# Patient Record
Sex: Male | Born: 2000 | Hispanic: Yes | Marital: Single | Smoking: Never smoker
Health system: Southern US, Community
[De-identification: ages and names within clinical notes are randomized; demographics above are authoritative.]

## PROBLEM LIST (undated history)

## (undated) ENCOUNTER — Ambulatory Visit (HOSPITAL_COMMUNITY): Admission: EM | Payer: Medicaid Other | Source: Home / Self Care

## (undated) DIAGNOSIS — L0291 Cutaneous abscess, unspecified: Secondary | ICD-10-CM

## (undated) DIAGNOSIS — L732 Hidradenitis suppurativa: Secondary | ICD-10-CM

## (undated) HISTORY — PX: ACHILLES TENDON SURGERY: SHX542

---
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
## 2020-07-09 ENCOUNTER — Other Ambulatory Visit: Payer: Self-pay

## 2020-07-09 ENCOUNTER — Encounter (HOSPITAL_BASED_OUTPATIENT_CLINIC_OR_DEPARTMENT_OTHER): Payer: Self-pay

## 2020-07-09 DIAGNOSIS — D696 Thrombocytopenia, unspecified: Secondary | ICD-10-CM | POA: Diagnosis present

## 2020-07-09 DIAGNOSIS — E861 Hypovolemia: Secondary | ICD-10-CM | POA: Diagnosis present

## 2020-07-09 DIAGNOSIS — E860 Dehydration: Secondary | ICD-10-CM | POA: Diagnosis present

## 2020-07-09 DIAGNOSIS — A020 Salmonella enteritis: Principal | ICD-10-CM | POA: Diagnosis present

## 2020-07-09 DIAGNOSIS — Z79899 Other long term (current) drug therapy: Secondary | ICD-10-CM

## 2020-07-09 DIAGNOSIS — Z20822 Contact with and (suspected) exposure to covid-19: Secondary | ICD-10-CM | POA: Diagnosis present

## 2020-07-09 DIAGNOSIS — Z28310 Unvaccinated for covid-19: Secondary | ICD-10-CM

## 2020-07-09 DIAGNOSIS — E876 Hypokalemia: Secondary | ICD-10-CM | POA: Diagnosis present

## 2020-07-09 DIAGNOSIS — E871 Hypo-osmolality and hyponatremia: Secondary | ICD-10-CM | POA: Diagnosis present

## 2020-07-09 LAB — CBC WITH DIFFERENTIAL/PLATELET
Abs Immature Granulocytes: 0.04 10*3/uL (ref 0.00–0.07)
Basophils Absolute: 0 10*3/uL (ref 0.0–0.1)
Basophils Relative: 1 %
Eosinophils Absolute: 0 10*3/uL (ref 0.0–0.5)
Eosinophils Relative: 0 %
HCT: 43.1 % (ref 39.0–52.0)
Hemoglobin: 16.1 g/dL (ref 13.0–17.0)
Immature Granulocytes: 1 %
Lymphocytes Relative: 14 %
Lymphs Abs: 1 10*3/uL (ref 0.7–4.0)
MCH: 31.4 pg (ref 26.0–34.0)
MCHC: 37.4 g/dL — ABNORMAL HIGH (ref 30.0–36.0)
MCV: 84 fL (ref 80.0–100.0)
Monocytes Absolute: 0.7 10*3/uL (ref 0.1–1.0)
Monocytes Relative: 10 %
Neutro Abs: 4.9 10*3/uL (ref 1.7–7.7)
Neutrophils Relative %: 74 %
Platelets: 137 10*3/uL — ABNORMAL LOW (ref 150–400)
RBC: 5.13 MIL/uL (ref 4.22–5.81)
RDW: 10.7 % — ABNORMAL LOW (ref 11.5–15.5)
Smear Review: NORMAL
WBC: 7 10*3/uL (ref 4.0–10.5)
nRBC: 0 % (ref 0.0–0.2)

## 2020-07-09 LAB — COMPREHENSIVE METABOLIC PANEL
ALT: 13 U/L (ref 0–44)
AST: 18 U/L (ref 15–41)
Albumin: 4.2 g/dL (ref 3.5–5.0)
Alkaline Phosphatase: 46 U/L (ref 38–126)
Anion gap: 14 (ref 5–15)
BUN: 18 mg/dL (ref 6–20)
CO2: 21 mmol/L — ABNORMAL LOW (ref 22–32)
Calcium: 9.1 mg/dL (ref 8.9–10.3)
Chloride: 89 mmol/L — ABNORMAL LOW (ref 98–111)
Creatinine, Ser: 1.27 mg/dL — ABNORMAL HIGH (ref 0.61–1.24)
GFR, Estimated: >60 mL/min (ref >60)
Glucose, Bld: 105 mg/dL — ABNORMAL HIGH (ref 70–99)
Potassium: 3.1 mmol/L — ABNORMAL LOW (ref 3.5–5.1)
Sodium: 124 mmol/L — ABNORMAL LOW (ref 135–145)
Total Bilirubin: 1.8 mg/dL — ABNORMAL HIGH (ref 0.3–1.2)
Total Protein: 7.7 g/dL (ref 6.5–8.1)

## 2020-07-09 LAB — LIPASE, BLOOD: Lipase: 26 U/L (ref 11–51)

## 2020-07-09 MED ORDER — ACETAMINOPHEN 325 MG PO TABS
650.0000 mg | ORAL_TABLET | Freq: Once | ORAL | Status: AC | PRN
  Administered 2020-07-09: 650 mg via ORAL
  Filled 2020-07-09: qty 2

## 2020-07-09 NOTE — ED Triage Notes (Signed)
Pt presents with complaints of generalized abd pain, fever and diarrhea x 3 days.

## 2020-07-09 NOTE — ED Triage Notes (Signed)
Emergency Medicine Provider Triage Evaluation Note  Kenneth Harrell , a 20 y.o. male  was evaluated in triage.  Pt complains of generalized abdominal pain, fever, and diarrhea for 3 days.  T-max 103 F.  Patient admits to nausea, but denies vomiting.  No previous abdominal operations.  Denies sick contacts or known Covid exposures.  He is currently unvaccinated for COVID-19.  Patient tested negative for Covid on Saturday.   Review of Systems  Positive: Abdominal pain, diarrhea, fever Negative: vomiting  Physical Exam  BP 126/71 (BP Location: Left Arm)   Pulse (!) 116   Temp (!) 101.6 F (38.7 C) (Oral)   Resp 20   Ht 5\' 8"  (1.727 m)   Wt 71.2 kg   SpO2 95%   BMI 23.87 kg/m  Gen:   Awake, no distress   HEENT:  Atraumatic  Resp:  Normal effort  Cardiac:  Normal rate  Abd:   Nondistended, diffuse abdominal tenderness MSK:   Moves extremities without difficulty Neuro:  Speech clear   Medical Decision Making  Medically screening exam initiated at 10:30 PM.  Appropriate orders placed.  Kenneth Harrell was informed that the remainder of the evaluation will be completed by another provider, this initial triage assessment does not replace that evaluation, and the importance of remaining in the ED until their evaluation is complete.  Clinical Impression     Kenneth Harrell 07/09/20 2310

## 2020-07-10 ENCOUNTER — Inpatient Hospital Stay (HOSPITAL_BASED_OUTPATIENT_CLINIC_OR_DEPARTMENT_OTHER)
Admission: EM | Admit: 2020-07-10 | Discharge: 2020-07-12 | DRG: 372 | Disposition: A | Discharge status: Home / Self Care | Payer: Medicaid Other | Attending: Family Medicine | Admitting: Family Medicine

## 2020-07-10 ENCOUNTER — Encounter (HOSPITAL_COMMUNITY): Payer: Self-pay | Admitting: Internal Medicine

## 2020-07-10 ENCOUNTER — Emergency Department (HOSPITAL_BASED_OUTPATIENT_CLINIC_OR_DEPARTMENT_OTHER): Payer: Medicaid Other

## 2020-07-10 DIAGNOSIS — Z28310 Unvaccinated for covid-19: Secondary | ICD-10-CM | POA: Diagnosis not present

## 2020-07-10 DIAGNOSIS — D696 Thrombocytopenia, unspecified: Secondary | ICD-10-CM | POA: Diagnosis not present

## 2020-07-10 DIAGNOSIS — E876 Hypokalemia: Secondary | ICD-10-CM | POA: Diagnosis not present

## 2020-07-10 DIAGNOSIS — Z79899 Other long term (current) drug therapy: Secondary | ICD-10-CM | POA: Diagnosis not present

## 2020-07-10 DIAGNOSIS — K529 Noninfective gastroenteritis and colitis, unspecified: Secondary | ICD-10-CM

## 2020-07-10 DIAGNOSIS — E860 Dehydration: Secondary | ICD-10-CM | POA: Diagnosis not present

## 2020-07-10 DIAGNOSIS — R197 Diarrhea, unspecified: Secondary | ICD-10-CM | POA: Diagnosis present

## 2020-07-10 DIAGNOSIS — E871 Hypo-osmolality and hyponatremia: Secondary | ICD-10-CM

## 2020-07-10 DIAGNOSIS — Z20822 Contact with and (suspected) exposure to covid-19: Secondary | ICD-10-CM | POA: Diagnosis not present

## 2020-07-10 DIAGNOSIS — A020 Salmonella enteritis: Secondary | ICD-10-CM | POA: Diagnosis not present

## 2020-07-10 DIAGNOSIS — E861 Hypovolemia: Secondary | ICD-10-CM | POA: Diagnosis not present

## 2020-07-10 LAB — CBC
HCT: 41.6 % (ref 39.0–52.0)
Hemoglobin: 15 g/dL (ref 13.0–17.0)
MCH: 31.2 pg (ref 26.0–34.0)
MCHC: 36.1 g/dL — ABNORMAL HIGH (ref 30.0–36.0)
MCV: 86.5 fL (ref 80.0–100.0)
Platelets: 134 10*3/uL — ABNORMAL LOW (ref 150–400)
RBC: 4.81 MIL/uL (ref 4.22–5.81)
RDW: 10.8 % — ABNORMAL LOW (ref 11.5–15.5)
WBC: 5.5 10*3/uL (ref 4.0–10.5)
nRBC: 0 % (ref 0.0–0.2)

## 2020-07-10 LAB — URINALYSIS, ROUTINE W REFLEX MICROSCOPIC
Glucose, UA: NEGATIVE mg/dL
Ketones, ur: 40 mg/dL — AB
Leukocytes,Ua: NEGATIVE
Nitrite: NEGATIVE
Protein, ur: 30 mg/dL — AB
Specific Gravity, Urine: 1.01 (ref 1.005–1.030)
pH: 6.5 (ref 5.0–8.0)

## 2020-07-10 LAB — COMPREHENSIVE METABOLIC PANEL
ALT: 13 U/L (ref 0–44)
AST: 17 U/L (ref 15–41)
Albumin: 3.8 g/dL (ref 3.5–5.0)
Alkaline Phosphatase: 46 U/L (ref 38–126)
Anion gap: 11 (ref 5–15)
BUN: 12 mg/dL (ref 6–20)
CO2: 26 mmol/L (ref 22–32)
Calcium: 8.6 mg/dL — ABNORMAL LOW (ref 8.9–10.3)
Chloride: 97 mmol/L — ABNORMAL LOW (ref 98–111)
Creatinine, Ser: 1 mg/dL (ref 0.61–1.24)
GFR, Estimated: >60 mL/min (ref >60)
Glucose, Bld: 96 mg/dL (ref 70–99)
Potassium: 3.3 mmol/L — ABNORMAL LOW (ref 3.5–5.1)
Sodium: 134 mmol/L — ABNORMAL LOW (ref 135–145)
Total Bilirubin: 1.5 mg/dL — ABNORMAL HIGH (ref 0.3–1.2)
Total Protein: 7.1 g/dL (ref 6.5–8.1)

## 2020-07-10 LAB — RESP PANEL BY RT-PCR (FLU A&B, COVID) ARPGX2
Influenza A by PCR: NEGATIVE
Influenza B by PCR: NEGATIVE
SARS Coronavirus 2 by RT PCR: NEGATIVE

## 2020-07-10 LAB — URINALYSIS, MICROSCOPIC (REFLEX)

## 2020-07-10 LAB — C DIFFICILE QUICK SCREEN W PCR REFLEX
C Diff antigen: NEGATIVE
C Diff interpretation: NOT DETECTED
C Diff toxin: NEGATIVE

## 2020-07-10 LAB — PHOSPHORUS: Phosphorus: 2 mg/dL — ABNORMAL LOW (ref 2.5–4.6)

## 2020-07-10 LAB — MAGNESIUM: Magnesium: 1.9 mg/dL (ref 1.7–2.4)

## 2020-07-10 LAB — HIV ANTIBODY (ROUTINE TESTING W REFLEX): HIV Screen 4th Generation wRfx: NONREACTIVE

## 2020-07-10 MED ORDER — ONDANSETRON HCL 4 MG/2ML IJ SOLN
4.0000 mg | Freq: Once | INTRAMUSCULAR | Status: DC
  Filled 2020-07-10: qty 2

## 2020-07-10 MED ORDER — SODIUM CHLORIDE 0.9% FLUSH
3.0000 mL | Freq: Two times a day (BID) | INTRAVENOUS | Status: DC
  Administered 2020-07-10 – 2020-07-12 (×5): 3 mL via INTRAVENOUS

## 2020-07-10 MED ORDER — POTASSIUM CHLORIDE 10 MEQ/100ML IV SOLN
10.0000 meq | INTRAVENOUS | Status: AC
  Administered 2020-07-10 (×2): 10 meq via INTRAVENOUS
  Filled 2020-07-10 (×2): qty 100

## 2020-07-10 MED ORDER — ACETAMINOPHEN 325 MG PO TABS
650.0000 mg | ORAL_TABLET | Freq: Four times a day (QID) | ORAL | Status: DC | PRN
  Administered 2020-07-11 (×2): 650 mg via ORAL
  Filled 2020-07-10: qty 2

## 2020-07-10 MED ORDER — POTASSIUM CHLORIDE 10 MEQ/100ML IV SOLN
10.0000 meq | INTRAVENOUS | Status: AC
  Administered 2020-07-10 (×4): 10 meq via INTRAVENOUS
  Filled 2020-07-10 (×4): qty 100

## 2020-07-10 MED ORDER — ONDANSETRON HCL 4 MG PO TABS: 4.0000 mg | ORAL_TABLET | Freq: Four times a day (QID) | ORAL | Status: DC | PRN

## 2020-07-10 MED ORDER — SODIUM CHLORIDE 0.9 % IV SOLN: INTRAVENOUS | Status: DC

## 2020-07-10 MED ORDER — ONDANSETRON HCL 4 MG/2ML IJ SOLN
4.0000 mg | Freq: Four times a day (QID) | INTRAMUSCULAR | Status: DC | PRN
  Administered 2020-07-11: 4 mg via INTRAVENOUS
  Filled 2020-07-10: qty 2

## 2020-07-10 MED ORDER — POTASSIUM PHOSPHATES 15 MMOLE/5ML IV SOLN
20.0000 mmol | Freq: Once | INTRAVENOUS | Status: AC
  Administered 2020-07-10: 20 mmol via INTRAVENOUS
  Filled 2020-07-10: qty 6.67

## 2020-07-10 MED ORDER — ACETAMINOPHEN 650 MG RE SUPP: 650.0000 mg | Freq: Four times a day (QID) | RECTAL | Status: DC | PRN

## 2020-07-10 MED ORDER — SODIUM CHLORIDE 0.9 % IV BOLUS
500.0000 mL | Freq: Once | INTRAVENOUS | Status: AC
  Administered 2020-07-10: 500 mL via INTRAVENOUS

## 2020-07-10 MED ORDER — SODIUM CHLORIDE 0.9 % IV SOLN: INTRAVENOUS | Status: AC

## 2020-07-10 MED ORDER — IOHEXOL 300 MG/ML  SOLN
100.0000 mL | Freq: Once | INTRAMUSCULAR | Status: AC | PRN
  Administered 2020-07-10: 100 mL via INTRAVENOUS

## 2020-07-10 MED ORDER — DICLOFENAC SODIUM 1 % EX GEL
4.0000 g | Freq: Four times a day (QID) | CUTANEOUS | Status: DC | PRN
  Administered 2020-07-10: 4 g via TOPICAL
  Filled 2020-07-10: qty 100

## 2020-07-10 NOTE — ED Notes (Signed)
Report from Hang, RN  

## 2020-07-10 NOTE — ED Notes (Signed)
Pt leaves with [facility],  Report called.

## 2020-07-10 NOTE — H&P (Addendum)
History and Physical  Kenneth Harrell HMC:[id number] DOB: [birth year] DOA: 07/10/2020  PCP: Pcp, No   Chief Complaint: diarrhea  HPI:  20 year old male no significant PMH presented with several days of voluminous diarrhea.  Admitted for hyponatremia, voluminous diarrhea, presumed gastroenteritis.  Symptoms began suddenly 4/1 with fever and fatigue.  4/2 he developed voluminous diarrhea.  Nonbloody.  Mild abdominal cramping.  No nausea or vomiting.  Able to drink a lot of water.  No specific aggravating or alleviating factors.  Father also has similar symptoms.  No other friends or family or coworkers do.  Chart review: . No hospitalizations   ED Course: Treated with potassium, IV fluids, Tylenol  Review of Systems:  As above.  In addition negative for visual changes, sore throat, rash, new muscle aches, chest pain, shortness of breath, dysuria, bleeding  PMH . None  PSH . Achilles tendon surgery  Family history includes: . No particular medical problems that he is aware of  Social History . Non-smoker, nondrinker, no drugs  Allergies . None  Medications Current Outpatient Medications  Medication Instructions  . acetaminophen (TYLENOL) 650 mg, Oral, Every 6 hours PRN  . finasteride (PROPECIA) 1 mg, Oral, Daily    Physicial Exam   Vitals:  . 99.0, 18, 89, 124/75, 98% on room air  Constitutional:   . Appears calm and comfortable Eyes:  . pupils and irises appear normal . Normal lids  ENMT:  . grossly normal hearing  . Lips appear normal Neck:  . neck appears normal, no masses . no thyromegaly Respiratory:  . CTA bilaterally, no w/r/r.  . Respiratory effort normal.  Cardiovascular:  . RRR, no m/r/g . No LE extremity edema   Abdomen:  . Abdomen appears normal.  No masses.  Mild lower abdominal tenderness to palpation. . No hernias noted Musculoskeletal:  . Digits/nails BUE: no clubbing, cyanosis, petechiae, infection . RUE, LUE, RLE, LLE   o strength and  tone grossly normal, no atrophy, no abnormal movements o No tenderness, masses Skin:  . No rashes, lesions, ulcers . palpation of skin: no induration or nodules Psychiatric:  . Mental status o Mood, affect appropriate  I have personally reviewed following labs and imaging studies  Labs:  [name] Sodium 124, potassium 3.1, creatinine elevated 1.27, total bilirubin 1.8. [name] Platelets slightly low 137, remainder CBC unremarkable . Urinalysis equivocal . Gastrointestinal panel pending . Covid negative  Imaging studies:   CT abdomen pelvis widespread large and small bowel fluid-filled loops, suspected infectious or inflammatory acute enterocolitis.  No complicating features.    ASSESSMENT/PLAN  Acute enteric colitis with voluminous diarrhea, without nausea or vomiting and only very mild lower abdominal pain.  Father with similar condition. --Suspect viral gastroenteritis.  No recent antibiotics, no leukocytosis, signs or symptoms to suggest C. difficile.  Shared food with his father but with many other coworkers as well none of whom are sick, food poisoning seems less likely. --Supportive care IV fluids, diet as tolerated, GI pathogen panel  Hyponatremia, dehydration secondary to GI losses --Asymptomatic.  Normal saline.  Slow correction.  Modest thrombocytopenia probably secondary to acute infection --Follow CBC  Hypokalemia. --Replete  DVT prophylaxis: SCDs Code Status: Full Family Communication: none Consults called: none    Time spent: 50 minutes  Brendia Sacks, MD  [facility] Direct contact: see [email or web address]  7PM-7AM contact night coverage as below   1. Check the care team in [facility] and look for a) attending/consulting [facility] provider listed and b) the [facility] team listed  2. Log into [email or web address] and use [facility] universal password to access. If you do not have the password, please contact the hospital operator. 3. Locate the [facility] provider you are looking for under  [facility] and page to a number that you can be directly reached. 4. If you still have difficulty reaching the provider, please page the [facility] (Director on Call) for the Hospitalists listed on amion for assistance.  Severity of Illness: The appropriate patient status for this patient is OBSERVATION. Observation status is judged to be reasonable and necessary in order to provide the required intensity of service to ensure the patient's safety. The patient's presenting symptoms, physical exam findings, and initial radiographic and laboratory data in the context of their medical condition is felt to place them at decreased risk for further clinical deterioration. Furthermore, it is anticipated that the patient will be medically stable for discharge from the hospital within 2 midnights of admission. The following factors support the patient status of observation.   " The patient's presenting symptoms include diarrhea. " The physical exam findings include mild lower abd pain. " The initial radiographic and laboratory data are hyponatremia, enterocoltis.    Status is: Observation  The patient remains OBS appropriate and will d/c before 2 midnights.  Dispo: The patient is from: Home              Anticipated d/c is to: Home              Patient currently is not medically stable to d/c.   Difficult to place patient No   07/10/2020, 11:49 AM   Principal Problem:   Enterocolitis Active Problems:   Hyponatremia   Dehydration   Thrombocytopenia (HCC)

## 2020-07-10 NOTE — ED Notes (Signed)
Report to Irving Burton, Education officer, environmental

## 2020-07-10 NOTE — ED Notes (Signed)
Report to [employer], [facility]

## 2020-07-10 NOTE — ED Provider Notes (Signed)
[facility] EMERGENCY DEPARTMENT Provider Note   CSN: [id number] Arrival date & time: 07/09/20  2158     History Chief Complaint  Patient presents with  . Abdominal Pain    Kenneth Harrell is a 20 y.o. male.  Patient reports lower abdominal pain, fever and diarrhea for the past 4 days.  Symptoms started as crampy lower abdominal pain with fever up to 102 and 103.  Father with similar symptoms.  No recent travel.  No antibiotic use.  Having profuse watery diarrhea up to 10 or 20 times daily.  Is nonbloody.  Reports drinking "a lot of water" to try to make up for diarrhea but no vomiting.  No pain with urination or blood in the urine.  No previous abdominal surgeries.  Most of his pain is crampy around his umbilicus.  He is unable to comment times of diarrhea has had for day but is nonbloody.  He denies any chest pain or shortness of breath.  No cough.  No runny nose or sore throat.  He is not COVID vaccinated. Reports his father has similar symptoms but did not travel anywhere.  No antibiotic use  The history is provided by the patient.  Abdominal Pain Associated symptoms: diarrhea, fatigue, fever and nausea   Associated symptoms: no chest pain, no cough, no dysuria, no hematuria, no shortness of breath and no vomiting        Past Medical History:  Diagnosis Date  . Abscess     There are no problems to display for this patient.   Past Surgical History:  Procedure Laterality Date  . ACHILLES TENDON SURGERY         No family history on file.  Social History   Tobacco Use  . Smoking status: Never Smoker  . Smokeless tobacco: Never Used  Vaping Use  . Vaping Use: Never used  Substance Use Topics  . Alcohol use: No  . Drug use: No    Home Medications Prior to Admission medications   Medication Sig Start Date End Date Taking? Authorizing Provider  amoxicillin (AMOXIL) 400 MG/5ML suspension Take 6.3 mLs (500 mg total) by mouth 3 (three) times daily. Patient  not taking: Reported on 10/14/2017 01/30/14   Palumbo, April, MD  amoxicillin (AMOXIL) 875 MG tablet Take 875 mg by mouth 2 (two) times daily.    [provider]  cephALEXin (KEFLEX) 500 MG capsule Take 1 capsule (500 mg total) by mouth 4 (four) times daily. Patient not taking: Reported on 10/14/2017 12/21/15   Felicie MornSmith, David, NP  finasteride (PROPECIA) 1 MG tablet Take 1 mg by mouth daily.    [provider]  guaiFENesin (MUCINEX) 600 MG 12 hr tablet Take 1,200 mg by mouth once.    [provider]  ibuprofen (ADVIL,MOTRIN) 200 MG tablet Take 200 mg by mouth every 6 (six) hours as needed.    [provider]  naproxen (NAPROSYN) 500 MG tablet Take 1 tablet (500 mg total) by mouth 2 (two) times daily. Patient not taking: Reported on 10/14/2017 12/21/15   Felicie MornSmith, David, NP  oseltamivir (TAMIFLU) 75 MG capsule Take 1 capsule (75 mg total) by mouth every 12 (twelve) hours. Patient not taking: Reported on 10/14/2017 05/27/15   Geoffery Lyonselo, Douglas, MD  traMADol (ULTRAM) 50 MG tablet Take 1 tablet (50 mg total) by mouth every 6 (six) hours as needed. Patient not taking: Reported on 10/14/2017 01/22/17   Jacinto HalimMaczis, Michael M, PA-C    Allergies    Patient has  no known allergies.  Review of Systems   Review of Systems  Constitutional: Positive for activity change, appetite change, fatigue and fever.  Respiratory: Negative for cough and shortness of breath.   Cardiovascular: Negative for chest pain.  Gastrointestinal: Positive for abdominal pain, diarrhea and nausea. Negative for vomiting.  Genitourinary: Negative for dysuria and hematuria.  Musculoskeletal: Positive for arthralgias and myalgias.  Skin: Negative for wound.  Neurological: Positive for weakness and headaches.   all other systems are negative except as noted in the HPI and PMH.    Physical Exam Updated Vital Signs BP (!) 143/91 (BP Location: Left Arm)   Pulse 72   Temp 98.9 F (37.2 C)   Resp 14   Ht 5\' 8"   (1.727 m)   Wt 71.2 kg   SpO2 100%   BMI 23.87 kg/m   Physical Exam Vitals and nursing note reviewed.  Constitutional:      General: He is not in acute distress.    Appearance: He is well-developed. He is ill-appearing.  HENT:     Head: Normocephalic and atraumatic.     Mouth/Throat:     Mouth: Mucous membranes are dry.     Pharynx: No oropharyngeal exudate.  Eyes:     Conjunctiva/sclera: Conjunctivae normal.     Pupils: Pupils are equal, round, and reactive to light.  Neck:     Comments: No meningismus. Cardiovascular:     Rate and Rhythm: Normal rate and regular rhythm.     Heart sounds: Normal heart sounds. No murmur heard.   Pulmonary:     Effort: Pulmonary effort is normal. No respiratory distress.     Breath sounds: Normal breath sounds.  Abdominal:     Palpations: Abdomen is soft.     Tenderness: There is abdominal tenderness. There is no guarding or rebound.     Comments: Periumbilical tenderness with voluntary guarding  Musculoskeletal:        General: No tenderness. Normal range of motion.     Cervical back: Normal range of motion and neck supple.  Skin:    General: Skin is warm.  Neurological:     Mental Status: He is alert and oriented to person, place, and time.     Cranial Nerves: No cranial nerve deficit.     Motor: No abnormal muscle tone.     Coordination: Coordination normal.     Comments: No ataxia on finger to nose bilaterally. No pronator drift. 5/5 strength throughout. CN 2-12 intact.Equal grip strength. Sensation intact.   Psychiatric:        Behavior: Behavior normal.     ED Results / Procedures / Treatments   Labs (all labs ordered are listed, but only abnormal results are displayed) Labs Reviewed  CBC WITH DIFFERENTIAL/PLATELET - Abnormal; Notable for the following components:      Result Value   MCHC 37.4 (*)    RDW 10.7 (*)    Platelets 137 (*)    All other components within normal limits  COMPREHENSIVE METABOLIC PANEL - Abnormal;  Notable for the following components:   Sodium 124 (*)    Potassium 3.1 (*)    Chloride 89 (*)    CO2 21 (*)    Glucose, Bld 105 (*)    Creatinine, Ser 1.27 (*)    Total Bilirubin 1.8 (*)    All other components within normal limits  URINALYSIS, ROUTINE W REFLEX MICROSCOPIC - Abnormal; Notable for the following components:   Hgb urine dipstick TRACE (*)  Bilirubin Urine SMALL (*)    Ketones, ur 40 (*)    Protein, ur 30 (*)    All other components within normal limits  URINALYSIS, MICROSCOPIC (REFLEX) - Abnormal; Notable for the following components:   Bacteria, UA RARE (*)    All other components within normal limits  RESP PANEL BY RT-PCR (FLU A&B, COVID) ARPGX2  C DIFFICILE QUICK SCREEN W PCR REFLEX  GASTROINTESTINAL PANEL BY PCR, STOOL (REPLACES STOOL CULTURE)  LIPASE, BLOOD    EKG None  Radiology CT ABDOMEN PELVIS W CONTRAST  Result Date: 07/10/2020 CLINICAL DATA:  20 year old male with abdominal pain, fever for 3 days, diarrhea. EXAM: CT ABDOMEN AND PELVIS WITH CONTRAST TECHNIQUE: Multidetector CT imaging of the abdomen and pelvis was performed using the standard protocol following bolus administration of intravenous contrast. CONTRAST:  OMNIPAQUE IOHEXOL 300 MG/ML  SOLN COMPARISON:  Chest radiographs 07/27/2017. FINDINGS: Lower chest: Negative. Hepatobiliary: Negative liver and gallbladder. Pancreas: Negative. Spleen: Negative.  Incidental splenule (normal variant). Adrenals/Urinary Tract: Negative. Adrenals, kidneys and ureters appear symmetric and normal. Unremarkable bladder. Stomach/Bowel: Fluid and mucosal hyperenhancement throughout the rectosigmoid colon (series 2, image 68). Mucosal hyperenhancement at the anal verge also. Borderline to mild large bowel wall thickening diffusely. With mild mucosal hyperenhancement also throughout the ascending and descending colon. Appendix also appears to be similarly fluid-filled and with mucosal hyperenhancement although  nondilated (series 2, image 61 and coronal image 45). There is no Peri appendiceal inflammatory stranding. And there is little to no background large bowel mesenteric inflammation. Similar wall thickening and mucosal hyperenhancement throughout the terminal ileum and multiple distal small bowel loops which are nondilated. Fluid-filled nondilated small bowel elsewhere. The stomach and duodenum have a more normal appearance. No free air. No free fluid. Vascular/Lymphatic: Major vascular structures in the abdomen and pelvis appear to be patent and normal. No lymphadenopathy. No lymphadenopathy. Reproductive: Negative. Other: No pelvic free fluid. Musculoskeletal: Negative. IMPRESSION: Widespread small and large bowel fluid-filled loops with mucosal hyperenhancement and mild bowel wall thickening. Suspect an infectious or inflammatory Acute Enterocolitis. Fluid in the large bowel to the rectum is compatible with diarrhea. No associated free fluid, obstruction, or other complicating features. Electronically Signed   By: Odessa Fleming M.D.   On: 07/10/2020 05:21    Procedures .Critical Care Performed by: Glynn Octave, MD Authorized by: Glynn Octave, MD   Critical care provider statement:    Critical care time (minutes):  35   Critical care was necessary to treat or prevent imminent or life-threatening deterioration of the following conditions:  Dehydration and metabolic crisis   Critical care was time spent personally by me on the following activities:  Discussions with consultants, evaluation of patient's response to treatment, examination of patient, ordering and performing treatments and interventions, ordering and review of laboratory studies, ordering and review of radiographic studies, pulse oximetry, re-evaluation of patient's condition, obtaining history from patient or surrogate and review of old charts     Medications Ordered in ED Medications  sodium chloride 0.9 % bolus 500 mL (has no  administration in time range)  0.9 %  sodium chloride infusion (has no administration in time range)  ondansetron (ZOFRAN) injection 4 mg (has no administration in time range)  potassium chloride 10 mEq in 100 mL IVPB (has no administration in time range)  acetaminophen (TYLENOL) tablet 650 mg (650 mg Oral Given 07/09/20 2218)    ED Course  I have reviewed the triage vital signs and the nursing notes.  Pertinent labs &  imaging results that were available during my care of the patient were reviewed by me and considered in my medical decision making (see chart for details).    MDM Rules/Calculators/A&P                         4 days of fever, diarrhea, abdominal pain.  His father with similar symptoms.  Vitals stable.  Periumbilical tenderness.  Labs in triage show sodium of 124.  No comparison. Patient does not drink alcohol. States he has been drinking water at home to try to make up for his diarrhea.  CT with enterocolitis. And diarrheal state. Stool studies ordered.  With hyponatremia, will require observation admission for gentle correction and supportive care.  D/w Dr. Toniann Fail. Final Clinical Impression(s) / ED Diagnoses Final diagnoses:  Enterocolitis  Hyponatremia    Rx / DC Orders ED Discharge Orders    None       Kelley Knoth, Jeannett Senior, MD 07/10/20 [phone]

## 2020-07-11 DIAGNOSIS — K529 Noninfective gastroenteritis and colitis, unspecified: Secondary | ICD-10-CM | POA: Diagnosis not present

## 2020-07-11 DIAGNOSIS — E871 Hypo-osmolality and hyponatremia: Secondary | ICD-10-CM | POA: Diagnosis present

## 2020-07-11 DIAGNOSIS — E876 Hypokalemia: Secondary | ICD-10-CM | POA: Diagnosis present

## 2020-07-11 DIAGNOSIS — A020 Salmonella enteritis: Secondary | ICD-10-CM | POA: Diagnosis present

## 2020-07-11 DIAGNOSIS — R197 Diarrhea, unspecified: Secondary | ICD-10-CM | POA: Diagnosis not present

## 2020-07-11 DIAGNOSIS — E860 Dehydration: Secondary | ICD-10-CM | POA: Diagnosis present

## 2020-07-11 DIAGNOSIS — Z28310 Unvaccinated for covid-19: Secondary | ICD-10-CM | POA: Diagnosis not present

## 2020-07-11 DIAGNOSIS — E861 Hypovolemia: Secondary | ICD-10-CM | POA: Diagnosis present

## 2020-07-11 DIAGNOSIS — D696 Thrombocytopenia, unspecified: Secondary | ICD-10-CM | POA: Diagnosis present

## 2020-07-11 DIAGNOSIS — Z20822 Contact with and (suspected) exposure to covid-19: Secondary | ICD-10-CM | POA: Diagnosis present

## 2020-07-11 DIAGNOSIS — Z79899 Other long term (current) drug therapy: Secondary | ICD-10-CM | POA: Diagnosis not present

## 2020-07-11 LAB — COMPREHENSIVE METABOLIC PANEL
ALT: 11 U/L (ref 0–44)
AST: 15 U/L (ref 15–41)
Albumin: 3.6 g/dL (ref 3.5–5.0)
Alkaline Phosphatase: 45 U/L (ref 38–126)
Anion gap: 9 (ref 5–15)
BUN: 8 mg/dL (ref 6–20)
CO2: 23 mmol/L (ref 22–32)
Calcium: 8.2 mg/dL — ABNORMAL LOW (ref 8.9–10.3)
Chloride: 101 mmol/L (ref 98–111)
Creatinine, Ser: 0.85 mg/dL (ref 0.61–1.24)
GFR, Estimated: >60 mL/min (ref >60)
Glucose, Bld: 107 mg/dL — ABNORMAL HIGH (ref 70–99)
Potassium: 3 mmol/L — ABNORMAL LOW (ref 3.5–5.1)
Sodium: 133 mmol/L — ABNORMAL LOW (ref 135–145)
Total Bilirubin: 1.4 mg/dL — ABNORMAL HIGH (ref 0.3–1.2)
Total Protein: 6.1 g/dL — ABNORMAL LOW (ref 6.5–8.1)

## 2020-07-11 LAB — GASTROINTESTINAL PANEL BY PCR, STOOL (REPLACES STOOL CULTURE)

## 2020-07-11 LAB — CBC
HCT: 37.8 % — ABNORMAL LOW (ref 39.0–52.0)
Hemoglobin: 13.3 g/dL (ref 13.0–17.0)
MCH: 30.9 pg (ref 26.0–34.0)
MCHC: 35.2 g/dL (ref 30.0–36.0)
MCV: 87.7 fL (ref 80.0–100.0)
Platelets: 159 10*3/uL (ref 150–400)
RBC: 4.31 MIL/uL (ref 4.22–5.81)
RDW: 10.9 % — ABNORMAL LOW (ref 11.5–15.5)
WBC: 6.3 10*3/uL (ref 4.0–10.5)
nRBC: 0 % (ref 0.0–0.2)

## 2020-07-11 LAB — MAGNESIUM: Magnesium: 1.7 mg/dL (ref 1.7–2.4)

## 2020-07-11 LAB — PHOSPHORUS: Phosphorus: 2.1 mg/dL — ABNORMAL LOW (ref 2.5–4.6)

## 2020-07-11 MED ORDER — SODIUM CHLORIDE 0.9 % IV SOLN
500.0000 mg | Freq: Once | INTRAVENOUS | Status: DC
  Filled 2020-07-11: qty 500

## 2020-07-11 MED ORDER — AZITHROMYCIN 250 MG PO TABS
500.0000 mg | ORAL_TABLET | Freq: Every day | ORAL | Status: DC
  Administered 2020-07-12: 500 mg via ORAL
  Filled 2020-07-11: qty 2

## 2020-07-11 MED ORDER — DEXTROSE 5 % IV SOLN
1000.0000 mg | Freq: Once | INTRAVENOUS | Status: AC
  Administered 2020-07-11: 1000 mg via INTRAVENOUS
  Filled 2020-07-11: qty 1000

## 2020-07-11 MED ORDER — POTASSIUM CHLORIDE CRYS ER 20 MEQ PO TBCR
40.0000 meq | EXTENDED_RELEASE_TABLET | ORAL | Status: AC
  Administered 2020-07-11 (×2): 40 meq via ORAL
  Filled 2020-07-11 (×2): qty 2

## 2020-07-11 NOTE — Progress Notes (Signed)
PROGRESS NOTE    Kenneth Harrell  CZY:[id number] DOB: 2000/08/28 DOA: 07/10/2020 PCP: Pcp, No   Chief Complaint  Patient presents with  . Abdominal Pain   Brief Narrative:  20 year old male no significant PMH presented with several days of voluminous diarrhea.  Admitted for hyponatremia, voluminous diarrhea, now found to have salmonella enterocolitis. He notes eating sunny side up eggs prior to his symptoms, dad ate similar food and was also ill.   Assessment & Plan:   Principal Problem:   Enterocolitis Active Problems:   Hyponatremia   Dehydration   Thrombocytopenia (HCC)  Salmonella Enterocolitis CT 4/5 with small and large bowel fluid filled loops with mucosal hyperenhancement and mild bowel wall thickening concerning for infectious vs inflammatory acute enterocolitis.  GI path panel notably positive for salmonella species Follow blood cx Abx discussed with pharm, will treat with 7 days of azithromycin  Continued significant diarrhea today, will continue inpatient treatment until this improves  Hyponatremia  Hypokalemia Likely hypovolemic hyponatremia in setting of diarrhea - follow with IVF Replace K and follow  Thrombocytopenia resolved  DVT prophylaxis: SCD Code Status: full  Family Communication: none at bedside Disposition:   Status is: Observation  The patient will require care spanning > 2 midnights and should be moved to inpatient because: Inpatient level of care appropriate due to severity of illness  Dispo: The patient is from: Home              Anticipated d/c is to: Home              Patient currently is not medically stable to d/c.   Difficult to place patient No       Consultants:   none  Procedures:   none  Antimicrobials:  Anti-infectives (From admission, onward)   Start     Dose/Rate Route Frequency Ordered Stop   07/12/20 1000  azithromycin (ZITHROMAX) tablet 500 mg        500 mg Oral Daily 07/11/20 0956 07/18/20 0959   07/11/20  1315  azithromycin (ZITHROMAX) 1,000 mg in dextrose 5 % 250 mL IVPB        1,000 mg 250 mL/hr over 60 Minutes Intravenous  Once 07/11/20 1218     07/11/20 1200  azithromycin (ZITHROMAX) 500 mg in sodium chloride 0.9 % 250 mL IVPB  Status:  Discontinued        500 mg 250 mL/hr over 60 Minutes Intravenous  Once 07/11/20 0956 07/11/20 1218     Subjective: No new complaints Diarrhea not much better  Objective: Vitals:   07/10/20 1002 07/10/20 1423 07/10/20 2209 07/11/20 0433  BP: 124/75 111/60 (!) 147/85 (!) 120/56  Pulse: 89 92 79 81  Resp: 18 18 18 18   Temp: 99 F (37.2 C) 99.3 F (37.4 C) 97.6 F (36.4 C) 98.5 F (36.9 C)  TempSrc: Oral Oral Oral Oral  SpO2: 98% 98% 100% 98%  Weight:      Height:        Intake/Output Summary (Last 24 hours) at 07/11/2020 1209 Last data filed at 07/11/2020 0500 Gross per 24 hour  Intake 1801.48 ml  Output --  Net 1801.48 ml   Filed Weights   07/09/20 2212  Weight: 71.2 kg    Examination:  General exam: Appears calm and comfortable  Respiratory system: Clear to auscultation. Respiratory effort normal. Cardiovascular system: S1 & S2 heard, RRR. Gastrointestinal system: Abdomen is nondistended, soft and mildly tender Central nervous system: Alert and oriented. No focal neurological deficits.  Extremities: no LEE Skin: No rashes, lesions or ulcers Psychiatry: Judgement and insight appear normal. Mood & affect appropriate.     Data Reviewed: I have personally reviewed following labs and imaging studies  CBC: Recent Labs  Lab 07/09/20 2301 07/10/20 1200 07/11/20 0338  WBC 7.0 5.5 6.3  NEUTROABS 4.9  --   --   HGB 16.1 15.0 13.3  HCT 43.1 41.6 37.8*  MCV 84.0 86.5 87.7  PLT 137* 134* 159    Basic Metabolic Panel: Recent Labs  Lab 07/09/20 2301 07/10/20 1200 07/11/20 0338  NA 124* 134* 133*  K 3.1* 3.3* 3.0*  CL 89* 97* 101  CO2 21* 26 23  GLUCOSE 105* 96 107*  BUN 18 12 8   CREATININE 1.27* 1.00 0.85  CALCIUM 9.1  8.6* 8.2*  MG  --  1.9 1.7  PHOS  --  2.0* 2.1*    GFR: Estimated Creatinine Clearance: 134.1 mL/min (by C-G formula based on SCr of 0.85 mg/dL).  Liver Function Tests: Recent Labs  Lab 07/09/20 2301 07/10/20 1200 07/11/20 0338  AST 18 17 15   ALT 13 13 11   ALKPHOS 46 46 45  BILITOT 1.8* 1.5* 1.4*  PROT 7.7 7.1 6.1*  ALBUMIN 4.2 3.8 3.6    CBG: No results for input(s): GLUCAP in the last 168 hours.   Recent Results (from the past 240 hour(s))  Resp Panel by RT-PCR (Flu Keshawna Dix&B, Covid) Nasopharyngeal Swab     Status: None   Collection Time: 07/09/20 12:42 AM   Specimen: Nasopharyngeal Swab; Nasopharyngeal(NP) swabs in vial transport medium  Result Value Ref Range Status   SARS Coronavirus 2 by RT PCR NEGATIVE NEGATIVE Final    Comment: (NOTE) SARS-CoV-2 target nucleic acids are NOT DETECTED.  The SARS-CoV-2 RNA is generally detectable in upper respiratory specimens during the acute phase of infection. The lowest concentration of SARS-CoV-2 viral copies this assay can detect is 138 copies/mL. Morghan Kester negative result does not preclude SARS-Cov-2 infection and should not be used as the sole basis for treatment or other patient management decisions. Kauan Kloosterman negative result may occur with  improper specimen collection/handling, submission of specimen other than nasopharyngeal swab, presence of viral mutation(s) within the areas targeted by this assay, and inadequate number of viral copies(<138 copies/mL). Analiese Krupka negative result must be combined with clinical observations, patient history, and epidemiological information. The expected result is Negative.  Fact Sheet for Patients:  BloggerCourse.comhttps://www.fda.gov/media/152166/download  Fact Sheet for Healthcare Providers:  SeriousBroker.ithttps://www.fda.gov/media/152162/download  This test is no t yet approved or cleared by the [place] FDA and  has been authorized for detection and/or diagnosis of SARS-CoV-2 by FDA under an Emergency Use Authorization (EUA).  This EUA will remain  in effect (meaning this test can be used) for the duration of the COVID-19 declaration under Section 564(b)(1) of the Act, 21 U.S.C.section 360bbb-3(b)(1), unless the authorization is terminated  or revoked sooner.       Influenza Helmi Hechavarria by PCR NEGATIVE NEGATIVE Final   Influenza B by PCR NEGATIVE NEGATIVE Final    Comment: (NOTE) The Xpert Xpress SARS-CoV-2/FLU/RSV plus assay is intended as an aid in the diagnosis of influenza from Nasopharyngeal swab specimens and should not be used as Zavior Thomason sole basis for treatment. Nasal washings and aspirates are unacceptable for Xpert Xpress SARS-CoV-2/FLU/RSV testing.  Fact Sheet for Patients: BloggerCourse.comhttps://www.fda.gov/media/152166/download  Fact Sheet for Healthcare Providers: SeriousBroker.ithttps://www.fda.gov/media/152162/download  This test is not yet approved or cleared by the [place] FDA and has been authorized for detection and/or diagnosis  of SARS-CoV-2 by FDA under an Emergency Use Authorization (EUA). This EUA will remain in effect (meaning this test can be used) for the duration of the COVID-19 declaration under Section 564(b)(1) of the Act, 21 U.S.C. section 360bbb-3(b)(1), unless the authorization is terminated or revoked.  Performed at [facility], [address] [place], [place] [address]   C Difficile Quick Screen w PCR reflex     Status: None   Collection Time: 07/10/20  7:25 AM   Specimen: STOOL  Result Value Ref Range Status   C Diff antigen NEGATIVE NEGATIVE Final   C Diff toxin NEGATIVE NEGATIVE Final   C Diff interpretation No C. difficile detected.  Final    Comment: Performed at [facility], [address] [place], [place] [address]  Gastrointestinal Panel by PCR , Stool     Status: Abnormal   Collection Time: 07/10/20  7:25 AM   Specimen: STOOL  Result Value Ref Range Status   Campylobacter species NOT DETECTED NOT DETECTED Final   Plesimonas shigelloides NOT DETECTED NOT DETECTED Final    Salmonella species DETECTED (Lisle Skillman) NOT DETECTED Final    Comment: RESULT CALLED TO, READ BACK BY AND VERIFIED WITH: Aldona Bar RN [phone] 07/11/20 HNM    Yersinia enterocolitica NOT DETECTED NOT DETECTED Final   Vibrio species NOT DETECTED NOT DETECTED Final   Vibrio cholerae NOT DETECTED NOT DETECTED Final   Enteroaggregative E coli (EAEC) NOT DETECTED NOT DETECTED Final   Enteropathogenic E coli (EPEC) NOT DETECTED NOT DETECTED Final   Enterotoxigenic E coli (ETEC) NOT DETECTED NOT DETECTED Final   Shiga like toxin producing E coli (STEC) NOT DETECTED NOT DETECTED Final   Shigella/Enteroinvasive E coli (EIEC) NOT DETECTED NOT DETECTED Final   Cryptosporidium NOT DETECTED NOT DETECTED Final   Cyclospora cayetanensis NOT DETECTED NOT DETECTED Final   Entamoeba histolytica NOT DETECTED NOT DETECTED Final   Giardia lamblia NOT DETECTED NOT DETECTED Final   Adenovirus F40/41 NOT DETECTED NOT DETECTED Final   Astrovirus NOT DETECTED NOT DETECTED Final   Norovirus GI/GII NOT DETECTED NOT DETECTED Final   Rotavirus Taran Hable NOT DETECTED NOT DETECTED Final   Sapovirus (I, II, IV, and V) NOT DETECTED NOT DETECTED Final    Comment: Performed at [facility], [address], [place], [place] [address]         Radiology Studies: CT ABDOMEN PELVIS W CONTRAST  Result Date: 07/10/2020 CLINICAL DATA:  20 year old male with abdominal pain, fever for 3 days, diarrhea. EXAM: CT ABDOMEN AND PELVIS WITH CONTRAST TECHNIQUE: Multidetector CT imaging of the abdomen and pelvis was performed using the standard protocol following bolus administration of intravenous contrast. CONTRAST:  OMNIPAQUE IOHEXOL 300 MG/ML  SOLN COMPARISON:  Chest radiographs 07/27/2017. FINDINGS: Lower chest: Negative. Hepatobiliary: Negative liver and gallbladder. Pancreas: Negative. Spleen: Negative.  Incidental splenule (normal variant). Adrenals/Urinary Tract: Negative. Adrenals, kidneys and ureters appear symmetric and  normal. Unremarkable bladder. Stomach/Bowel: Fluid and mucosal hyperenhancement throughout the rectosigmoid colon (series 2, image 68). Mucosal hyperenhancement at the anal verge also. Borderline to mild large bowel wall thickening diffusely. With mild mucosal hyperenhancement also throughout the ascending and descending colon. Appendix also appears to be similarly fluid-filled and with mucosal hyperenhancement although nondilated (series 2, image 61 and coronal image 45). There is no Peri appendiceal inflammatory stranding. And there is little to no background large bowel mesenteric inflammation. Similar wall thickening and mucosal hyperenhancement throughout the terminal ileum and multiple distal small bowel  loops which are nondilated. Fluid-filled nondilated small bowel elsewhere. The stomach and duodenum have Willard Madrigal more normal appearance. No free air. No free fluid. Vascular/Lymphatic: Major vascular structures in the abdomen and pelvis appear to be patent and normal. No lymphadenopathy. No lymphadenopathy. Reproductive: Negative. Other: No pelvic free fluid. Musculoskeletal: Negative. IMPRESSION: Widespread small and large bowel fluid-filled loops with mucosal hyperenhancement and mild bowel wall thickening. Suspect an infectious or inflammatory Acute Enterocolitis. Fluid in the large bowel to the rectum is compatible with diarrhea. No associated free fluid, obstruction, or other complicating features. Electronically Signed   By: Odessa Fleming M.D.   On: 07/10/2020 05:21        Scheduled Meds: . [START ON 07/12/2020] azithromycin  500 mg Oral Daily  . ondansetron  4 mg Intravenous Once  . potassium chloride  40 mEq Oral Q4H  . sodium chloride flush  3 mL Intravenous Q12H   Continuous Infusions: . sodium chloride 125 mL/hr at 07/10/20 0415  . sodium chloride 125 mL/hr at 07/11/20 0605  . azithromycin       LOS: 0 days    Time spent: over 30 min    Lacretia Nicks, MD [facility]   To  contact the attending provider between 7A-7P or the covering provider during after hours 7P-7A, please log into the web site [email or web address] and access using universal [place] password for that web site. If you do not have the password, please call the hospital operator.  07/11/2020, 12:09 PM

## 2020-07-12 LAB — CBC WITH DIFFERENTIAL/PLATELET
Abs Immature Granulocytes: 0.12 10*3/uL — ABNORMAL HIGH (ref 0.00–0.07)
Basophils Absolute: 0.1 10*3/uL (ref 0.0–0.1)
Basophils Relative: 1 %
Eosinophils Absolute: 0.1 10*3/uL (ref 0.0–0.5)
Eosinophils Relative: 1 %
HCT: 37.6 % — ABNORMAL LOW (ref 39.0–52.0)
Hemoglobin: 13.1 g/dL (ref 13.0–17.0)
Immature Granulocytes: 2 %
Lymphocytes Relative: 20 %
Lymphs Abs: 1.5 10*3/uL (ref 0.7–4.0)
MCH: 30.8 pg (ref 26.0–34.0)
MCHC: 34.8 g/dL (ref 30.0–36.0)
MCV: 88.5 fL (ref 80.0–100.0)
Monocytes Absolute: 1.3 10*3/uL — ABNORMAL HIGH (ref 0.1–1.0)
Monocytes Relative: 19 %
Neutro Abs: 4.1 10*3/uL (ref 1.7–7.7)
Neutrophils Relative %: 57 %
Platelets: 175 10*3/uL (ref 150–400)
RBC: 4.25 MIL/uL (ref 4.22–5.81)
RDW: 11.1 % — ABNORMAL LOW (ref 11.5–15.5)
WBC: 7.1 10*3/uL (ref 4.0–10.5)
nRBC: 0 % (ref 0.0–0.2)

## 2020-07-12 LAB — COMPREHENSIVE METABOLIC PANEL
ALT: 13 U/L (ref 0–44)
AST: 16 U/L (ref 15–41)
Albumin: 3.3 g/dL — ABNORMAL LOW (ref 3.5–5.0)
Alkaline Phosphatase: 43 U/L (ref 38–126)
Anion gap: 9 (ref 5–15)
BUN: 10 mg/dL (ref 6–20)
CO2: 26 mmol/L (ref 22–32)
Calcium: 8.5 mg/dL — ABNORMAL LOW (ref 8.9–10.3)
Chloride: 103 mmol/L (ref 98–111)
Creatinine, Ser: 0.92 mg/dL (ref 0.61–1.24)
GFR, Estimated: >60 mL/min (ref >60)
Glucose, Bld: 100 mg/dL — ABNORMAL HIGH (ref 70–99)
Potassium: 3.1 mmol/L — ABNORMAL LOW (ref 3.5–5.1)
Sodium: 138 mmol/L (ref 135–145)
Total Bilirubin: 1.1 mg/dL (ref 0.3–1.2)
Total Protein: 6 g/dL — ABNORMAL LOW (ref 6.5–8.1)

## 2020-07-12 LAB — MAGNESIUM: Magnesium: 1.7 mg/dL (ref 1.7–2.4)

## 2020-07-12 LAB — PHOSPHORUS: Phosphorus: 2.4 mg/dL — ABNORMAL LOW (ref 2.5–4.6)

## 2020-07-12 MED ORDER — WITCH HAZEL-GLYCERIN EX PADS
MEDICATED_PAD | CUTANEOUS | Status: DC | PRN
  Filled 2020-07-12: qty 100

## 2020-07-12 MED ORDER — POTASSIUM CHLORIDE ER 10 MEQ PO TBCR: 20.0000 meq | EXTENDED_RELEASE_TABLET | Freq: Every day | ORAL | 0 refills | Status: AC

## 2020-07-12 MED ORDER — AZITHROMYCIN 250 MG PO TABS: 500.0000 mg | ORAL_TABLET | Freq: Every day | ORAL | 0 refills | Status: AC

## 2020-07-12 MED ORDER — HYDROCORTISONE (PERIANAL) 2.5 % EX CREA: 1.0000 "application " | TOPICAL_CREAM | Freq: Two times a day (BID) | CUTANEOUS | 0 refills | Status: AC

## 2020-07-12 MED ORDER — POTASSIUM CHLORIDE CRYS ER 20 MEQ PO TBCR
40.0000 meq | EXTENDED_RELEASE_TABLET | ORAL | Status: AC
  Administered 2020-07-12 (×2): 40 meq via ORAL
  Filled 2020-07-12 (×2): qty 2

## 2020-07-12 NOTE — Discharge Instructions (Signed)
Diarrhea, Adult Diarrhea is when you pass loose and watery poop (stool) often. Diarrhea can make you feel weak and cause you to lose water in your body (get dehydrated). Losing water in your body can cause you to:  Feel tired and thirsty.  Have Ilisha Blust dry mouth.  Go pee (urinate) less often. Diarrhea often lasts 2-3 days. However, it can last longer if it is Yuriel Lopezmartinez sign of something more serious. It is important to treat your diarrhea as told by your doctor. Follow these instructions at home: Eating and drinking Follow these instructions as told by your doctor:  Take an ORS (oral rehydration solution). This is Kesia Dalto drink that helps you replace fluids and minerals your body lost. It is sold at pharmacies and stores.  Drink plenty of fluids, such as: ? Water. ? Ice chips. ? Diluted fruit juice. ? Low-calorie sports drinks. ? Milk, if you want.  Avoid drinking fluids that have Deneshia Zucker lot of sugar or caffeine in them.  Eat bland, easy-to-digest foods in small amounts as you are able. These foods include: ? Bananas. ? Applesauce. ? Rice. ? Low-fat (lean) meats. ? Toast. ? Crackers.  Avoid alcohol.  Avoid spicy or fatty foods.      Medicines  Take over-the-counter and prescription medicines only as told by your doctor.  If you were prescribed an antibiotic medicine, take it as told by your doctor. Do not stop using the antibiotic even if you start to feel better. General instructions  Wash your hands often using soap and water. If soap and water are not available, use Andalyn Heckstall hand sanitizer. Others in your home should wash their hands as well. Hands should be washed: ? After using the toilet or changing Laylee Schooley diaper. ? Before preparing, cooking, or serving food. ? While caring for Brodi Kari sick person. ? While visiting someone in [facility].  Drink enough fluid to keep your pee (urine) pale yellow.  Rest at home while you get better.  Watch your condition for any changes.  Take Marilyne Haseley warm bath to help  with any burning or pain from having diarrhea.  Keep all follow-up visits as told by your doctor. This is important.   Contact Theseus Birnie doctor if:  You have Conita Amenta fever.  Your diarrhea gets worse.  You have new symptoms.  You cannot keep fluids down.  You feel light-headed or dizzy.  You have Brinnley Lacap headache.  You have muscle cramps. Get help right away if:  You have chest pain.  You feel very weak or you pass out (faint).  You have bloody or black poop or poop that looks like tar.  You have very bad pain, cramping, or bloating in your belly (abdomen).  You have trouble breathing or you are breathing very quickly.  Your heart is beating very quickly.  Your skin feels cold and clammy.  You feel confused.  You have signs of losing too much water in your body, such as: ? Dark pee, very little pee, or no pee. ? Cracked lips. ? Dry mouth. ? Sunken eyes. ? Sleepiness. ? Weakness. Summary  Diarrhea is when you pass loose and watery poop (stool) often.  Diarrhea can make you feel weak and cause you to lose water in your body (get dehydrated).  Take an ORS (oral rehydration solution). This is Carmencita Cusic drink that is sold at pharmacies and stores.  Eat bland, easy-to-digest foods in small amounts as you are able.  Contact Shatha Hooser doctor if your condition gets worse. Get help   right away if you have signs that you have lost too much water in your body. This information is not intended to replace advice given to you by your health care provider. Make sure you discuss any questions you have with your health care provider. Document Revised: 08/28/2017 Document Reviewed: 08/28/2017 Elsevier Patient Education  2021 Elsevier Inc.  

## 2020-07-12 NOTE — Discharge Summary (Signed)
Physician Discharge Summary  Kenneth Harrell NWG:[id number] DOB: 08-Nov-2000 DOA: 07/10/2020  Kenneth Harrell: Kenneth Harrell, No  Admit date: 07/10/2020 Discharge date: 07/12/2020  Time spent: 40 minutes  Recommendations for Outpatient Follow-up:  1. Follow outpatient CBC/CMP 2. Follow pending blood cultures  3. Follow diarrhea outpatient   Discharge Diagnoses:  Principal Problem:   Enterocolitis Active Problems:   Hyponatremia   Dehydration   Thrombocytopenia (HCC)   Salmonella enteritis   Discharge Condition: stable  Diet recommendation: regular  Filed Weights   07/09/20 2212  Weight: 71.2 kg    History of present illness:  20 year old male no significant PMH presented with several days of voluminous diarrhea. Admitted for hyponatremia, voluminous diarrhea, now found to have salmonella enterocolitis. He notes eating sunny side up eggs prior to his symptoms, dad ate similar food and was also ill.   He's gradually improved with antibiotics, he has continued diarrhea today, but feels well enough to discharge and is able to maintain his oral intake at this time.  Plan for discharge with oral antibiotics and return precautions.    Hospital Course:  Salmonella Enterocolitis CT 4/5 with small and large bowel fluid filled loops with mucosal hyperenhancement and mild bowel wall thickening concerning for infectious vs inflammatory acute enterocolitis.  GI path panel notably positive for salmonella species Follow blood cx - NGx1 day Abx discussed with pharm, will treat with 7 days of azithromycin  Continued diarrhea today, but suspect he'll be able to keep up with needs given improvement in symptoms - discussed return precautions   Hyponatremia  Hypokalemia Hyponatremia improved Will d/c home with supplemental potassium  Thrombocytopenia resolved  Procedures:  none  Consultations:  none  Discharge Exam: Vitals:   07/12/20 0507 07/12/20 1423  BP: 127/71 129/81  Pulse: 86 76  Resp: 18 18   Temp: 98.7 F (37.1 C) 98.9 F (37.2 C)  SpO2: 99% 100%   Continued diarrhea, but feeling better, stronger Able to keep food and fluids down He and mother comfortable discharging - discussed return precautions  General: No acute distress. Cardiovascular: Heart sounds show Kenneth Harrell regular rate, and rhythm Lungs: Clear to auscultation bilaterally  Abdomen: Soft, nontender, nondistended Neurological: Alert and oriented 3. Moves all extremities 4. Cranial nerves II through XII grossly intact. Skin: Warm and dry. No rashes or lesions. Extremities: No clubbing or cyanosis. No edema.   Discharge Instructions   Discharge Instructions    Call MD for:  difficulty breathing, headache or visual disturbances   Complete by: As directed    Call MD for:  extreme fatigue   Complete by: As directed    Call MD for:  hives   Complete by: As directed    Call MD for:  persistant dizziness or light-headedness   Complete by: As directed    Call MD for:  persistant nausea and vomiting   Complete by: As directed    Call MD for:  redness, tenderness, or signs of infection (pain, swelling, redness, odor or green/yellow discharge around incision site)   Complete by: As directed    Call MD for:  severe uncontrolled pain   Complete by: As directed    Call MD for:  temperature >100.4   Complete by: As directed    Diet - low sodium heart healthy   Complete by: As directed    Discharge instructions   Complete by: As directed    You were seen for salmonella enterocolitis.    You've improved, but your diarrhea has continued.  It's  important that you stay hydrated with gatorade or pedialyte to help replace the fluids and electrolytes you'll lose through the diarrhea.  We'll send you home with Kenneth Harrell course of azithromycin for 5 days.   You have [name] pending blood culture that's so far no growth.  Please follow the final results with your Kenneth Harrell.    Return for new, recurrent, or worsening symptoms.   Please ask your  Kenneth Harrell to request records from this hospitalization so they know what was done and what the next steps will be.   Increase activity slowly   Complete by: As directed      Allergies as of 07/12/2020   No Known Allergies     Medication List    TAKE these medications   acetaminophen 325 MG tablet Commonly known as: TYLENOL Take 650 mg by mouth every 6 (six) hours as needed for mild pain, fever or headache.   azithromycin 250 MG tablet Commonly known as: ZITHROMAX Take 2 tablets (500 mg total) by mouth daily for 5 days.   Kenneth Harrell 1 MG tablet Commonly known as: PROPECIA Take 1 mg by mouth daily.   hydrocortisone 2.5 % rectal cream Commonly known as: Anusol-HC Place 1 application rectally 2 (two) times daily for 5 days.   potassium chloride 10 MEQ tablet Commonly known as: KLOR-CON Take 2 tablets (20 mEq total) by mouth daily for 3 days.      No Known Allergies    The results of significant diagnostics from this hospitalization (including imaging, microbiology, ancillary and laboratory) are listed below for reference.    Significant Diagnostic Studies: CT ABDOMEN PELVIS W CONTRAST  Result Date: 07/10/2020 CLINICAL DATA:  20 year old male with abdominal pain, fever for 3 days, diarrhea. EXAM: CT ABDOMEN AND PELVIS WITH CONTRAST TECHNIQUE: Multidetector CT imaging of the abdomen and pelvis was performed using the standard protocol following bolus administration of intravenous contrast. CONTRAST:  100mL OMNIPAQUE IOHEXOL 300 MG/ML  SOLN COMPARISON:  Chest radiographs 07/27/2017. FINDINGS: Lower chest: Negative. Hepatobiliary: Negative liver and gallbladder. Pancreas: Negative. Spleen: Negative.  Incidental splenule (normal variant). Adrenals/Urinary Tract: Negative. Adrenals, kidneys and ureters appear symmetric and normal. Unremarkable bladder. Stomach/Bowel: Fluid and mucosal hyperenhancement throughout the rectosigmoid colon (series 2, image 68). Mucosal hyperenhancement at the  anal verge also. Borderline to mild large bowel wall thickening diffusely. With mild mucosal hyperenhancement also throughout the ascending and descending colon. Appendix also appears to be similarly fluid-filled and with mucosal hyperenhancement although nondilated (series 2, image 61 and coronal image 45). There is no Peri appendiceal inflammatory stranding. And there is little to no background large bowel mesenteric inflammation. Similar wall thickening and mucosal hyperenhancement throughout the terminal ileum and multiple distal small bowel loops which are nondilated. Fluid-filled nondilated small bowel elsewhere. The stomach and duodenum have Kenneth Harrell more normal appearance. No free air. No free fluid. Vascular/Lymphatic: Major vascular structures in the abdomen and pelvis appear to be patent and normal. No lymphadenopathy. No lymphadenopathy. Reproductive: Negative. Other: No pelvic free fluid. Musculoskeletal: Negative. IMPRESSION: Widespread small and large bowel fluid-filled loops with mucosal hyperenhancement and mild bowel wall thickening. Suspect an infectious or inflammatory Acute Enterocolitis. Fluid in the large bowel to the rectum is compatible with diarrhea. No associated free fluid, obstruction, or other complicating features. Electronically Signed   By: Odessa FlemingH  Hall M.D.   On: 07/10/2020 05:21    Microbiology: Recent Results (from the past 240 hour(s))  Resp Panel by RT-PCR (Flu Kenneth Harrell&B, Covid) Nasopharyngeal Swab     Status:  None   Collection Time: 07/09/20 12:42 AM   Specimen: Nasopharyngeal Swab; Nasopharyngeal(NP) swabs in vial transport medium  Result Value Ref Range Status   SARS Coronavirus 2 by RT PCR NEGATIVE NEGATIVE Final    Comment: (NOTE) SARS-CoV-2 target nucleic acids are NOT DETECTED.  The SARS-CoV-2 RNA is generally detectable in upper respiratory specimens during the acute phase of infection. The lowest concentration of SARS-CoV-2 viral copies this assay can detect is 138  copies/mL. Kenneth Harrell negative result does not preclude SARS-Cov-2 infection and should not be used as the sole basis for treatment or other patient management decisions. Kenneth Harrell negative result may occur with  improper specimen collection/handling, submission of specimen other than nasopharyngeal swab, presence of viral mutation(s) within the areas targeted by this assay, and inadequate number of viral copies(<138 copies/mL). Kenneth Harrell negative result must be combined with clinical observations, patient history, and epidemiological information. The expected result is Negative.  Fact Sheet for Patients:  BloggerCourse.com  Fact Sheet for Healthcare Providers:  SeriousBroker.it  This test is no t yet approved or cleared by the [place] FDA and  has been authorized for detection and/or diagnosis of SARS-CoV-2 by FDA under an Emergency Use Authorization (EUA). This EUA will remain  in effect (meaning this test can be used) for the duration of the COVID-19 declaration under Section 564(b)(1) of the Act, 21 U.S.C.section 360bbb-3(b)(1), unless the authorization is terminated  or revoked sooner.       Influenza Nanako Stopher by PCR NEGATIVE NEGATIVE Final   Influenza B by PCR NEGATIVE NEGATIVE Final    Comment: (NOTE) The Xpert Xpress SARS-CoV-2/FLU/RSV plus assay is intended as an aid in the diagnosis of influenza from Nasopharyngeal swab specimens and should not be used as Kenneth Harrell sole basis for treatment. Nasal washings and aspirates are unacceptable for Xpert Xpress SARS-CoV-2/FLU/RSV testing.  Fact Sheet for Patients: BloggerCourse.com  Fact Sheet for Healthcare Providers: SeriousBroker.it  This test is not yet approved or cleared by the [place] FDA and has been authorized for detection and/or diagnosis of SARS-CoV-2 by FDA under an Emergency Use Authorization (EUA). This EUA will remain in effect (meaning  this test can be used) for the duration of the COVID-19 declaration under Section 564(b)(1) of the Act, 21 U.S.C. section 360bbb-3(b)(1), unless the authorization is terminated or revoked.  Performed at [facility], [address] [place], [place] [address]   C Difficile Quick Screen w PCR reflex     Status: None   Collection Time: 07/10/20  7:25 AM   Specimen: STOOL  Result Value Ref Range Status   C Diff antigen NEGATIVE NEGATIVE Final   C Diff toxin NEGATIVE NEGATIVE Final   C Diff interpretation No C. difficile detected.  Final    Comment: Performed at [facility], [address] [place], [place] [address]  Gastrointestinal Panel by PCR , Stool     Status: Abnormal   Collection Time: 07/10/20  7:25 AM   Specimen: STOOL  Result Value Ref Range Status   Campylobacter species NOT DETECTED NOT DETECTED Final   Plesimonas shigelloides NOT DETECTED NOT DETECTED Final   Salmonella species DETECTED (Robbert Langlinais) NOT DETECTED Final    Comment: RESULT CALLED TO, READ BACK BY AND VERIFIED WITH: Aldona Bar RN [phone] 07/11/20 HNM    Yersinia enterocolitica NOT DETECTED NOT DETECTED Final   Vibrio species NOT DETECTED NOT DETECTED Final   Vibrio cholerae NOT DETECTED NOT DETECTED Final   Enteroaggregative E coli (EAEC) NOT  DETECTED NOT DETECTED Final   Enteropathogenic E coli (EPEC) NOT DETECTED NOT DETECTED Final   Enterotoxigenic E coli (ETEC) NOT DETECTED NOT DETECTED Final   Shiga like toxin producing E coli (STEC) NOT DETECTED NOT DETECTED Final   Shigella/Enteroinvasive E coli (EIEC) NOT DETECTED NOT DETECTED Final   Cryptosporidium NOT DETECTED NOT DETECTED Final   Cyclospora cayetanensis NOT DETECTED NOT DETECTED Final   Entamoeba histolytica NOT DETECTED NOT DETECTED Final   Giardia lamblia NOT DETECTED NOT DETECTED Final   Adenovirus F40/41 NOT DETECTED NOT DETECTED Final   Astrovirus NOT DETECTED NOT DETECTED Final   Norovirus GI/GII NOT DETECTED NOT DETECTED Final    Rotavirus Juliet Vasbinder NOT DETECTED NOT DETECTED Final   Sapovirus (I, II, IV, and V) NOT DETECTED NOT DETECTED Final    Comment: Performed at [facility], [address] [place], [place] [address]  Culture, blood (routine x 2)     Status: None (Preliminary result)   Collection Time: 07/11/20 11:41 AM   Specimen: BLOOD  Result Value Ref Range Status   Specimen Description   Final    BLOOD LEFT ANTECUBITAL Performed at [facility], [address] [place], [place] [address]    Special Requests   Final    BOTTLES DRAWN AEROBIC ONLY Blood Culture adequate volume Performed at [facility], [address], [place], [place] [address]    Culture   Final    NO GROWTH 1 DAY Performed at [facility], [address] [place], [place] [address]    Report Status PENDING  Incomplete     Labs: Basic Metabolic Panel: Recent Labs  Lab 07/09/20 2301 07/10/20 1200 07/11/20 0338 07/12/20 0320  NA 124* 134* 133* 138  K 3.1* 3.3* 3.0* 3.1*  CL 89* 97* 101 103  CO2 21* 26 23 26   GLUCOSE 105* 96 107* 100*  BUN 18 12 8 10   CREATININE 1.27* 1.00 0.85 0.92  CALCIUM 9.1 8.6* 8.2* 8.5*  MG  --  1.9 1.7 1.7  PHOS  --  2.0* 2.1* 2.4*   Liver Function Tests: Recent Labs  Lab 07/09/20 2301 07/10/20 1200 07/11/20 0338 07/12/20 0320  AST 18 17 15 16   ALT 13 13 11 13   ALKPHOS 46 46 45 43  BILITOT 1.8* 1.5* 1.4* 1.1  PROT 7.7 7.1 6.1* 6.0*  ALBUMIN 4.2 3.8 3.6 3.3*   Recent Labs  Lab 07/09/20 2301  LIPASE 26   No results for input(s): AMMONIA in the last 168 hours. CBC: Recent Labs  Lab 07/09/20 2301 07/10/20 1200 07/11/20 0338 07/12/20 0320  WBC 7.0 5.5 6.3 7.1  NEUTROABS 4.9  --   --  4.1  HGB 16.1 15.0 13.3 13.1  HCT 43.1 41.6 37.8* 37.6*  MCV 84.0 86.5 87.7 88.5  PLT 137* 134* 159 175   Cardiac Enzymes: No results for input(s): CKTOTAL, CKMB, CKMBINDEX, TROPONINI in the last 168 hours. BNP: BNP (last 3 results) No results  for input(s): BNP in the last 8760 hours.  ProBNP (last 3 results) No results for input(s): PROBNP in the last 8760 hours.  CBG: No results for input(s): GLUCAP in the last 168 hours.     Signed:  09/08/20 MD.  [facility] 07/12/2020, 2:49 PM

## 2020-07-16 LAB — CULTURE, BLOOD (ROUTINE X 2)
Culture: NO GROWTH
Special Requests: ADEQUATE

## 2020-07-18 ENCOUNTER — Encounter (HOSPITAL_COMMUNITY): Payer: Self-pay | Admitting: Emergency Medicine

## 2020-07-18 ENCOUNTER — Emergency Department (HOSPITAL_COMMUNITY)
Admission: EM | Admit: 2020-07-18 | Discharge: 2020-07-18 | Disposition: A | Discharge status: Home / Self Care | Payer: Medicaid Other | Attending: Emergency Medicine | Admitting: Emergency Medicine

## 2020-07-18 ENCOUNTER — Other Ambulatory Visit: Payer: Self-pay

## 2020-07-18 ENCOUNTER — Emergency Department (HOSPITAL_COMMUNITY): Payer: Medicaid Other

## 2020-07-18 DIAGNOSIS — R42 Dizziness and giddiness: Secondary | ICD-10-CM | POA: Diagnosis not present

## 2020-07-18 DIAGNOSIS — R197 Diarrhea, unspecified: Secondary | ICD-10-CM | POA: Insufficient documentation

## 2020-07-18 LAB — CBC WITH DIFFERENTIAL/PLATELET
Abs Immature Granulocytes: 0.12 10*3/uL — ABNORMAL HIGH (ref 0.00–0.07)
Basophils Absolute: 0.1 10*3/uL (ref 0.0–0.1)
Basophils Relative: 1 %
Eosinophils Absolute: 0.2 10*3/uL (ref 0.0–0.5)
Eosinophils Relative: 2 %
HCT: 38.4 % — ABNORMAL LOW (ref 39.0–52.0)
Hemoglobin: 13.5 g/dL (ref 13.0–17.0)
Immature Granulocytes: 1 %
Lymphocytes Relative: 44 %
Lymphs Abs: 4.4 10*3/uL — ABNORMAL HIGH (ref 0.7–4.0)
MCH: 31.3 pg (ref 26.0–34.0)
MCHC: 35.2 g/dL (ref 30.0–36.0)
MCV: 88.9 fL (ref 80.0–100.0)
Monocytes Absolute: 0.7 10*3/uL (ref 0.1–1.0)
Monocytes Relative: 7 %
Neutro Abs: 4.7 10*3/uL (ref 1.7–7.7)
Neutrophils Relative %: 45 %
Platelets: 437 10*3/uL — ABNORMAL HIGH (ref 150–400)
RBC: 4.32 MIL/uL (ref 4.22–5.81)
RDW: 11.2 % — ABNORMAL LOW (ref 11.5–15.5)
WBC: 10.1 10*3/uL (ref 4.0–10.5)
nRBC: 0 % (ref 0.0–0.2)

## 2020-07-18 LAB — BASIC METABOLIC PANEL
Anion gap: 10 (ref 5–15)
BUN: 10 mg/dL (ref 6–20)
CO2: 26 mmol/L (ref 22–32)
Calcium: 8.8 mg/dL — ABNORMAL LOW (ref 8.9–10.3)
Chloride: 104 mmol/L (ref 98–111)
Creatinine, Ser: 1.01 mg/dL (ref 0.61–1.24)
GFR, Estimated: >60 mL/min (ref >60)
Glucose, Bld: 100 mg/dL — ABNORMAL HIGH (ref 70–99)
Potassium: 3.3 mmol/L — ABNORMAL LOW (ref 3.5–5.1)
Sodium: 140 mmol/L (ref 135–145)

## 2020-07-18 MED ORDER — SODIUM CHLORIDE 0.9 % IV BOLUS
1000.0000 mL | Freq: Once | INTRAVENOUS | Status: AC
  Administered 2020-07-18: 1000 mL via INTRAVENOUS

## 2020-07-18 NOTE — ED Provider Notes (Signed)
[facility]-EMERGENCY DEPT Provider Note   CSN: [id number] Arrival date & time: 07/18/20  2013     History Chief Complaint  Patient presents with  . Dizziness    Austyn Seier is a 20 y.o. male.  20 year old male with prior medical history as detailed below presents for evaluation. He reports recent admission for salmonella gastroenteritis. He complains of continued diarrhea. He reports mild dizziness with standing.   No reported recent vomiting, fever, or abdominal pain.   No focal weakness. No visual change. No speech change.   The history is provided by the patient and medical records.  Dizziness Quality:  Lightheadedness Severity:  Mild Onset quality:  Gradual Duration:  2 days Timing:  Intermittent Progression:  Waxing and waning Chronicity:  New Context: standing up   Relieved by:  Nothing Worsened by:  Nothing Ineffective treatments:  None tried      Past Medical History:  Diagnosis Date  . Abscess     Patient Active Problem List   Diagnosis Date Noted  . Salmonella enteritis 07/11/2020  . Enterocolitis 07/10/2020  . Hyponatremia 07/10/2020  . Dehydration 07/10/2020  . Thrombocytopenia (HCC) 07/10/2020    Past Surgical History:  Procedure Laterality Date  . ACHILLES TENDON SURGERY         Family History  Family history unknown: Yes    Social History   Tobacco Use  . Smoking status: Never Smoker  . Smokeless tobacco: Never Used  Vaping Use  . Vaping Use: Never used  Substance Use Topics  . Alcohol use: No  . Drug use: No    Home Medications Prior to Admission medications   Medication Sig Start Date End Date Taking? Authorizing Provider  acetaminophen (TYLENOL) 325 MG tablet Take 650 mg by mouth every 6 (six) hours as needed for mild pain, fever or headache.    [provider]  finasteride (PROPECIA) 1 MG tablet Take 1 mg by mouth daily.    [provider]  potassium chloride (KLOR-CON) 10 MEQ  tablet Take 2 tablets (20 mEq total) by mouth daily for 3 days. 07/12/20 07/15/20  Zigmund Daniel., MD    Allergies    Patient has no known allergies.  Review of Systems   Review of Systems  Neurological: Positive for dizziness.    Physical Exam Updated Vital Signs BP 123/76   Pulse 74   Temp 98.3 F (36.8 C) (Oral)   Resp 20   Ht 5\' 8"  (1.727 m)   Wt 71.2 kg   SpO2 100%   BMI 23.87 kg/m   Physical Exam Vitals and nursing note reviewed.  Constitutional:      General: He is not in acute distress.    Appearance: Normal appearance. He is well-developed.  HENT:     Head: Normocephalic and atraumatic.  Eyes:     Conjunctiva/sclera: Conjunctivae normal.     Pupils: Pupils are equal, round, and reactive to light.  Cardiovascular:     Rate and Rhythm: Normal rate and regular rhythm.     Heart sounds: Normal heart sounds.  Pulmonary:     Effort: Pulmonary effort is normal. No respiratory distress.     Breath sounds: Normal breath sounds.  Abdominal:     General: There is no distension.     Palpations: Abdomen is soft.     Tenderness: There is no abdominal tenderness.  Musculoskeletal:        General: No deformity. Normal range of motion.  Cervical back: Normal range of motion and neck supple.  Skin:    General: Skin is warm and dry.  Neurological:     Mental Status: He is alert and oriented to person, place, and time.     ED Results / Procedures / Treatments   Labs (all labs ordered are listed, but only abnormal results are displayed) Labs Reviewed  BASIC METABOLIC PANEL - Abnormal; Notable for the following components:      Result Value   Potassium 3.3 (*)    Glucose, Bld 100 (*)    Calcium 8.8 (*)    All other components within normal limits  CBC WITH DIFFERENTIAL/PLATELET - Abnormal; Notable for the following components:   HCT 38.4 (*)    RDW 11.2 (*)    Platelets 437 (*)    Lymphs Abs 4.4 (*)    Abs Immature Granulocytes 0.12 (*)    All other  components within normal limits    EKG None  Radiology CT Head Wo Contrast  Result Date: 07/18/2020 CLINICAL DATA:  Slurred speech, lightheadedness, dizziness EXAM: CT HEAD WITHOUT CONTRAST TECHNIQUE: Contiguous axial images were obtained from the base of the skull through the vertex without intravenous contrast. COMPARISON:  None. FINDINGS: Brain: Normal anatomic configuration. No abnormal intra or extra-axial mass lesion or fluid collection. No abnormal mass effect or midline shift. No evidence of acute intracranial hemorrhage or infarct. Ventricular size is normal. Cerebellum unremarkable. Vascular: Unremarkable Skull: Intact Sinuses/Orbits: Mucous retention cyst within the right maxillary sinus is partially visualized. Remaining paranasal sinuses are clear. Orbits are unremarkable. Other: Mastoid air cells and middle ear cavities are clear. IMPRESSION: No acute intracranial abnormality. Minimal right maxillary sinus disease Electronically Signed   By: Helyn Numbers MD   On: 07/18/2020 21:45    Procedures Procedures   Medications Ordered in ED Medications  sodium chloride 0.9 % bolus 1,000 mL (1,000 mLs Intravenous New Bag/Given 07/18/20 2110)    ED Course  I have reviewed the triage vital signs and the nursing notes.  Pertinent labs & imaging results that were available during my care of the patient were reviewed by me and considered in my medical decision making (see chart for details).    MDM Rules/Calculators/A&P                          MDM  MSE complete  Carmello Cabiness was evaluated in Emergency Department on 07/18/2020 for the symptoms described in the history of present illness. He was evaluated in the context of the global COVID-19 pandemic, which necessitated consideration that the patient might be at risk for infection with the SARS-CoV-2 virus that causes COVID-19. Institutional protocols and algorithms that pertain to the evaluation of patients at risk for COVID-19 are  in a state of rapid change based on information released by regulatory bodies including the CDC and federal and state organizations. These policies and algorithms were followed during the patient's care in the ED.   Patient is presenting for evaluation of reported lightheadedness.  Patient is presentation is consistent with likely mild dehydration given his recent gastroenteritis.  Patient is improved after IV fluids.  Screening labs are without significant abnormality.  Screening imaging is without acute pathology.  Patient understands need for close follow-up.  Strict return precautions given understood.   Final Clinical Impression(s) / ED Diagnoses Final diagnoses:  Dizziness    Rx / DC Orders ED Discharge Orders    None  Wynetta Fines, MD 07/18/20 2325

## 2020-07-18 NOTE — Discharge Instructions (Addendum)
Please return for any problem.  °

## 2020-07-18 NOTE — ED Triage Notes (Signed)
Patient presents with complaints of slurred speech, lightheadedness and dizziness since yesterday. He was recently discharged from the hospital. While in the the hospital he was recovering from salmonella.

## 2020-09-22 ENCOUNTER — Other Ambulatory Visit: Payer: Self-pay

## 2020-09-22 ENCOUNTER — Emergency Department (HOSPITAL_COMMUNITY)
Admission: EM | Admit: 2020-09-22 | Discharge: 2020-09-22 | Disposition: A | Discharge status: Home / Self Care | Payer: Medicaid Other | Attending: Emergency Medicine | Admitting: Emergency Medicine

## 2020-09-22 ENCOUNTER — Encounter (HOSPITAL_COMMUNITY): Payer: Self-pay | Admitting: *Deleted

## 2020-09-22 DIAGNOSIS — L02412 Cutaneous abscess of left axilla: Secondary | ICD-10-CM | POA: Diagnosis not present

## 2020-09-22 DIAGNOSIS — L0291 Cutaneous abscess, unspecified: Secondary | ICD-10-CM

## 2020-09-22 DIAGNOSIS — M79602 Pain in left arm: Secondary | ICD-10-CM | POA: Diagnosis present

## 2020-09-22 MED ORDER — CLINDAMYCIN HCL 150 MG PO CAPS: 300.0000 mg | ORAL_CAPSULE | Freq: Four times a day (QID) | ORAL | 0 refills | Status: AC

## 2020-09-22 NOTE — ED Triage Notes (Signed)
Pt complains of abscess to left armpit. He has hx of HS, says same area forms abscess that has to be drained, last drained 1 month ago.

## 2020-09-22 NOTE — Discharge Instructions (Addendum)
-  Prescription for clindamycin sent to your pharmacy.  This is a pill to help treat infection.  Stop using the clindamycin lotion while taking this antibiotic.  It can cause diarrhea so you should take it with a probiotic.  You can talk to the pharmacist about probiotics when you pick up the prescription.  -Continue take Tylenol and ibuprofen for pain at home.  Continue using a warm compress to help draw out the infection.  -Follow-up with your dermatologist or general surgery for further evaluation of your abscess.  I have included the information for the local surgery clinic here in [place].  Call them if needed.  Return to the ER if you have any worsening fever, chills, or redness from the wound.

## 2020-09-22 NOTE — ED Provider Notes (Signed)
[facility]-EMERGENCY DEPT Provider Note   CSN: [id number] Arrival date & time: 09/22/20  0654     History Chief Complaint  Patient presents with   Abscess    Kenneth Harrell is a 20 y.o. male with past medical history significant for hidradenitis suppurativa, thrombocytopenia.  Followed by [facility] dermatology.   HPI Patient presents to emergency room today with chief complaint of abscess under left axilla.  He states has been there x1 year.  The abscess will occasionally grow in size and have purulent drainage.  He is prescribed doxycycline for HS flares and finished a prescription x1 week ago.  He states it did not help the current abscess that he has.  He has had green drainage from the abscess.  He has been taking Tylenol for pain at home.  He endorses throbbing pain localized to the abscess.  He rates the pain 4 out of 10 in severity.  He denies any fever or chills.  Patient states he is here for definitive care of the abscess.   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 Authorizing Provider  clindamycin (CLEOCIN) 150 MG capsule Take 2 capsules (300 mg total) by mouth every 6 (six) hours for 7 days. 09/22/20 09/29/20 Yes Walisiewicz, Jeanifer Halliday E, PA-C  acetaminophen (TYLENOL) 325 MG tablet Take 650 mg by mouth every 6 (six) hours as  needed for mild pain, fever or headache.    [provider]  finasteride (PROPECIA) 1 MG tablet Take 1 mg by mouth daily.    [provider]  potassium chloride (KLOR-CON) 10 MEQ tablet Take 2 tablets (20 mEq total) by mouth daily for 3 days. 07/12/20 07/15/20  Zigmund Daniel., MD    Allergies    Patient has no known allergies.  Review of Systems   Review of Systems All other systems are reviewed and are negative for acute change except as noted in the HPI.  Physical Exam Updated Vital Signs BP 128/72 (BP Location: Right Arm)   Pulse (!) 59   Temp 98.4 F (36.9 C) (Oral)   Resp 18   SpO2 98%   Physical Exam Vitals and nursing note reviewed.  Constitutional:      Appearance: He is well-developed. He is not ill-appearing or toxic-appearing.  HENT:     Head: Normocephalic and atraumatic.     Nose: Nose normal.  Eyes:     General: No scleral icterus.       Right eye: No discharge.        Left eye: No discharge.     Conjunctiva/sclera: Conjunctivae normal.  Neck:     Vascular: No JVD.  Cardiovascular:     Rate and Rhythm: Normal rate and regular rhythm.     Pulses:  Normal pulses.     Heart sounds: Normal heart sounds.  Pulmonary:     Effort: Pulmonary effort is normal.     Breath sounds: Normal breath sounds.  Abdominal:     General: There is no distension.  Musculoskeletal:        General: Normal range of motion.     Cervical back: Normal range of motion.  Skin:    General: Skin is warm and dry.     Comments: 2 x 1 cm area of induration in left axilla with purulent drainage.  No surrounding erythema or palpable fluctuance.  Mildly tender to palpation.  Neurological:     Mental Status: He is oriented to person, place, and time.     GCS: GCS eye subscore is 4. GCS verbal subscore is 5. GCS motor subscore is 6.     Comments: Fluent speech, no facial droop.  Psychiatric:        Behavior: Behavior normal.    ED Results / Procedures / Treatments    Labs (all labs ordered are listed, but only abnormal results are displayed) Labs Reviewed - No data to display  EKG None  Radiology No results found.  Procedures Procedures   Medications Ordered in ED Medications - No data to display  ED Course  I have reviewed the triage vital signs and the nursing notes.  Pertinent labs & imaging results that were available during my care of the patient were reviewed by me and considered in my medical decision making (see chart for details).    MDM Rules/Calculators/A&P                          History provided by patient with additional history obtained from chart review.    Patient presenting with abscess under left axilla consistent with his hidradenitis suppurativa diagnosis.  He is afebrile.  He is nontoxic-appearing.  Abscess already has purulent drainage.  Had lengthy discussion with patient regarding I&D.  I am willing to perform I&D to help with pain relief.  Patient is requesting definitive treatment of the abscess.  He is asking me to remove the capsule so that it will not come back anymore.  I discussed the importance of follow-up with dermatology or even possibly general surgery for this.  Patient states he has had the abscess drained before and would rather see the specialist instead of having it drained right now.  I feel this is reasonable.  Patient failed recent doxycycline so will prescribe course of clindamycin.  Strongly encourage patient to use warm compresses and take Tylenol ibuprofen for pain at home.  He is agreeable with plan of care.  Discussed strict return precautions including signs out indicate sepsis.  Patient eager to be discharged home.  Discharged home in stable condition.    Portions of this note were generated with Scientist, clinical (histocompatibility and immunogenetics). Dictation errors may occur despite best attempts at proofreading.  Final Clinical Impression(s) / ED Diagnoses Final diagnoses:  Abscess    Rx / DC Orders ED  Discharge Orders          Ordered    clindamycin (CLEOCIN) 150 MG capsule  Every 6 hours        09/22/20 0829             Shanon Ace, PA-C 09/22/20 0109    Bethann Berkshire, MD 09/24/20 1007

## 2021-08-07 ENCOUNTER — Ambulatory Visit: Payer: Self-pay | Admitting: Surgery

## 2021-08-07 NOTE — H&P (Signed)
?  Kenneth Harrell ?[id number]  ? ?Referring Provider:  Self ? ? ?Subjective  ? ?Chief Complaint: acne inversa ?  ? ? ?History of Present Illness: ?21 year old male with history of hidradenitis suppurativa presents for evaluation of the same.  He is followed by [facility] dermatology and has previously been on doxycycline for hidradenitis flares.Kenneth Harrell  He was last seen in the [facility] system in June 2022 with a left axillary abscess which has been present for at least a year. ?He states that he has not had any major flares in the interim that have required drainage, but continues to experience a painful knot, 1 in each axilla, left worse than right.  Denies any disease elsewhere. ?He does not smoke.  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 ? ? ?Review of Systems: ?A complete review of systems was obtained from the patient.  I have reviewed this information and discussed as appropriate with the patient.  See HPI as well for other ROS. ? ? ?Medical History: ?History reviewed. No pertinent past medical history. ? ?Patient Active Problem List  ?Diagnosis  ? Cardiac murmur  ? ? ?History reviewed. No pertinent surgical history.  ? ?No Known Allergies ? ?Current Outpatient Medications on File Prior to Visit  ?Medication Sig Dispense Refill  ? ketoconazole (NIZORAL) 2 % cream APPLY TOPICALLY D TO RASH ON BODY  0  ? mupirocin (BACTROBAN) 2 % ointment Use 3 times a day on area that has drained until it heals    ? traMADol (ULTRAM) 50 mg tablet Take by mouth    ? ?No current facility-administered medications on file prior to visit.  ? ? ?History reviewed. No pertinent family history.  ? ?Social History  ? ?Tobacco Use  ?Smoking Status Never  ?Smokeless Tobacco Never  ?  ? ?Social History  ? ?Socioeconomic History  ? Marital status: Single  ?Tobacco Use  ? Smoking status: Never  ? Smokeless tobacco: Never  ? ? ?Objective:  ? ? ?Vitals:  ? 08/07/21 1453  ?BP: 98/72  ?Pulse: 77  ?Temp: 36.7 ?C (98.1 ?F)  ?SpO2: 98%  ?Weight: 74.3 kg  (163 lb 12.8 oz)  ?Height: 172.7 cm (5\' 8" )  ?  ?Body mass index is 24.91 kg/m?. ? ?Alert and well-appearing ?Unlabored respirations ?In the left axilla there is an approximately 3 x 2 cm area of chronic induration without active drainage, in the right axilla there is an area about 1.5 x 1 cm of chronic induration without active inflammation. ? ?Assessment and Plan:  ?Diagnoses and all orders for this visit: ? ?Hidradenitis axillaris ? ?Fairly limited disease in both axilla, stable over the last several years.  Discussed options and I recommend proceeding with a local excision of the chronically inflamed areas in both axilla.  We discussed the procedure in detail including the technique, risks of bleeding, infection, pain, scarring, wound healing problems/need for local wound care, and recurrence of hidradenitis.  Questions were welcomed and answered.  He wishes to proceed with surgery. ? ? ?Kenneth Harrell , MD  ?

## 2022-04-05 IMAGING — CT CT HEAD W/O CM
3 series · 15 of 47 positions shown, 18 images · non-contrast
Comparison: None.

CLINICAL DATA: Slurred speech, lightheadedness, dizziness

EXAM:
CT HEAD WITHOUT CONTRAST
TECHNIQUE: Contiguous axial images were obtained from the base of the skull
through the vertex without intravenous contrast.

[Series 2: head wo · axial · 0.44mm/px · z∈[+1443,+1578]mm · 9 of 33 slices shown, 12 images]
[im 3/33  brain]
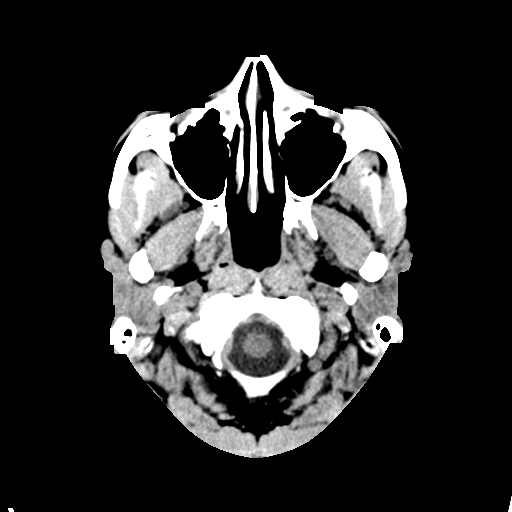
[im 3/33  bone]
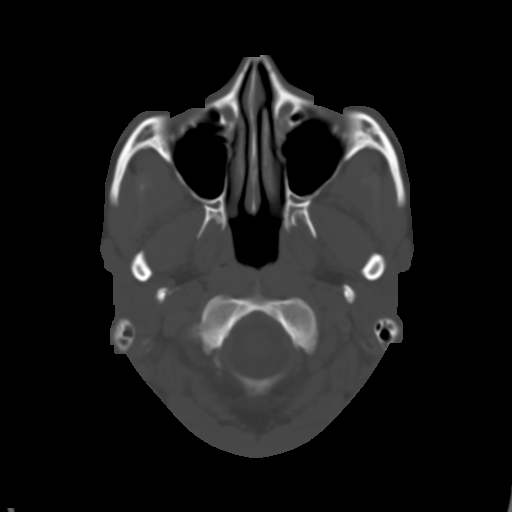
[im 6/33  brain]
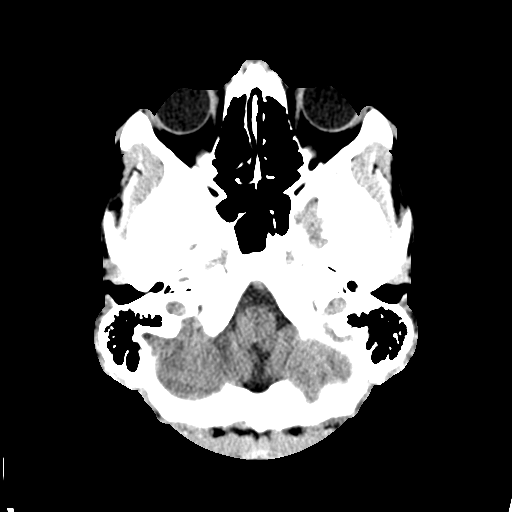
[im 9/33  brain]
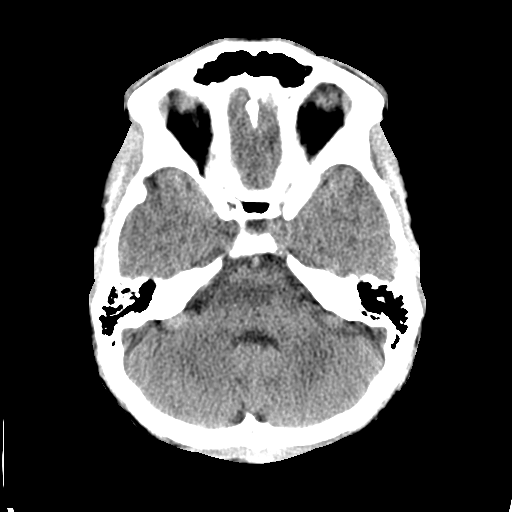
[im 13/33  brain]
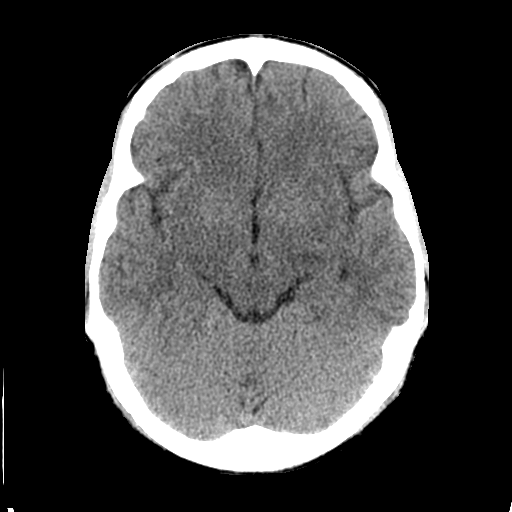
[im 17/33  brain]
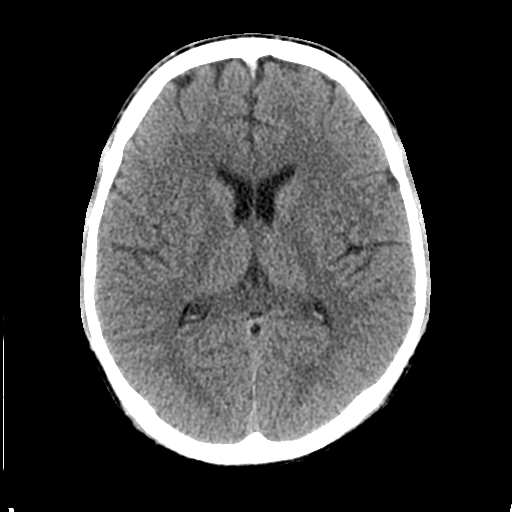
[im 17/33  bone]
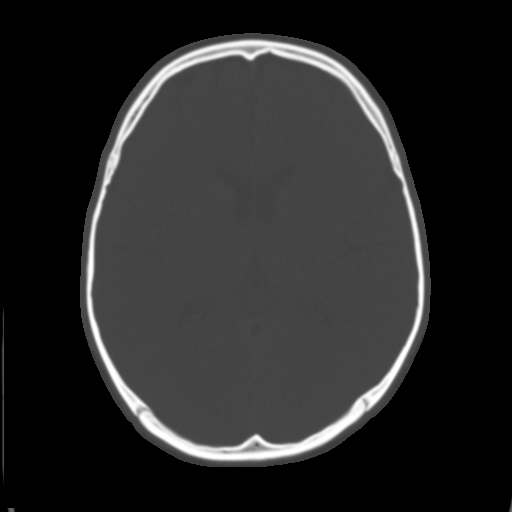
[im 20/33  brain]
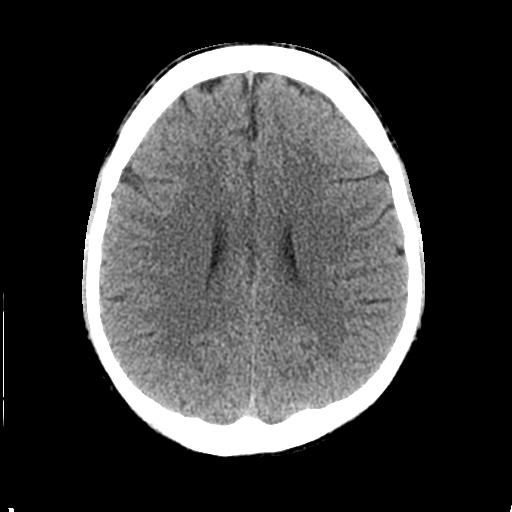
[im 24/33  brain]
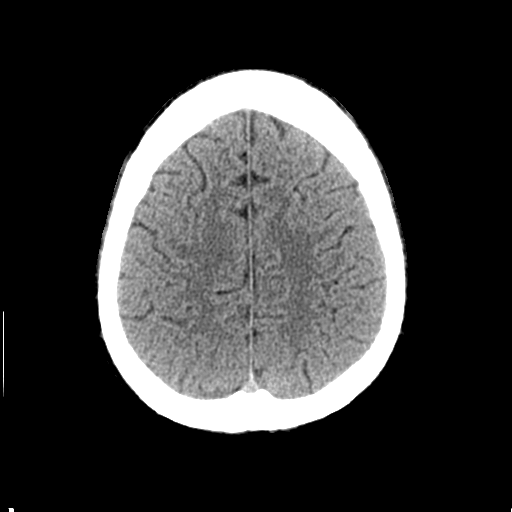
[im 27/33  brain]
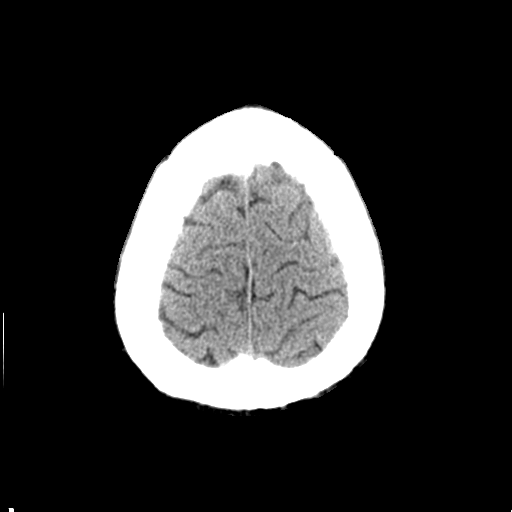
[im 30/33  brain]
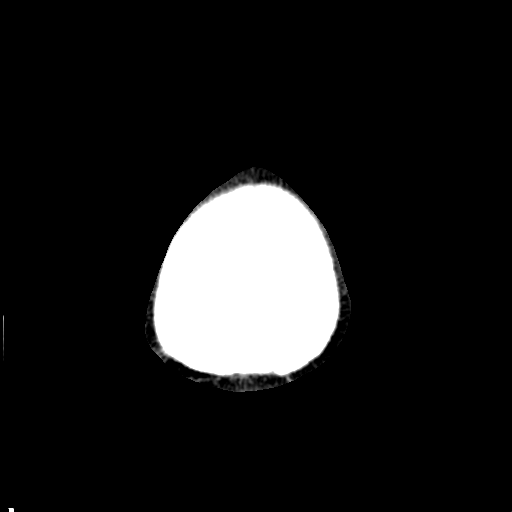
[im 30/33  bone]
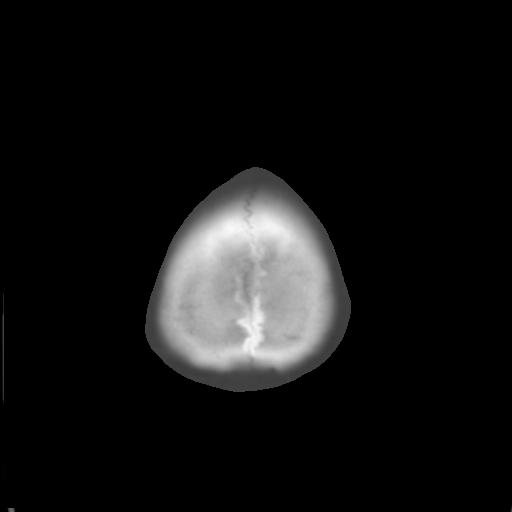

[Series 4: coronal soft tissue · coronal · 0.31mm/px · 3 of 74 slices shown]
[im 25/74  brain]
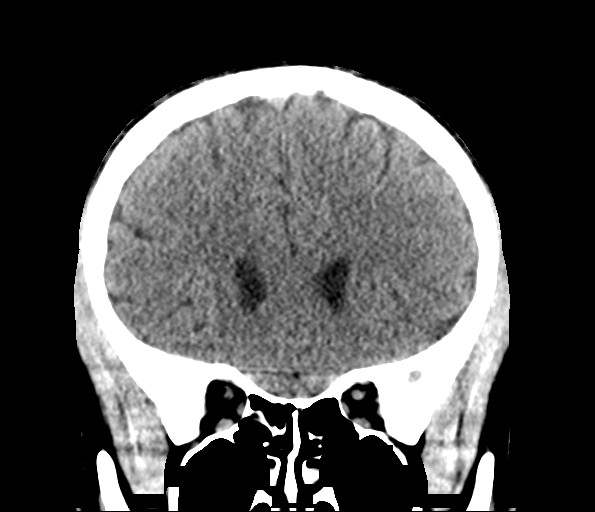
[im 33/74  brain]
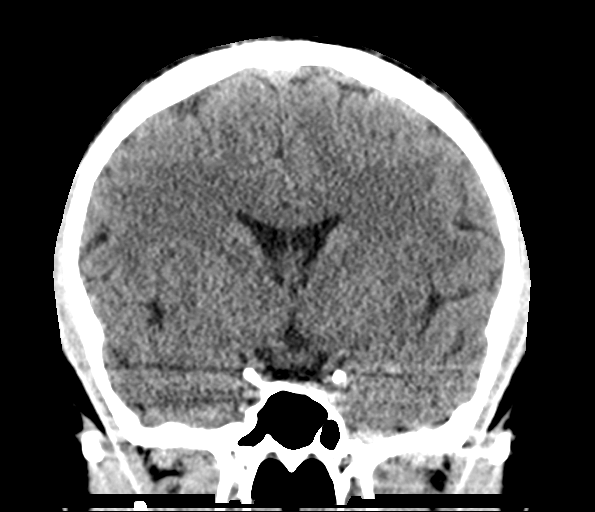
[im 41/74  brain]
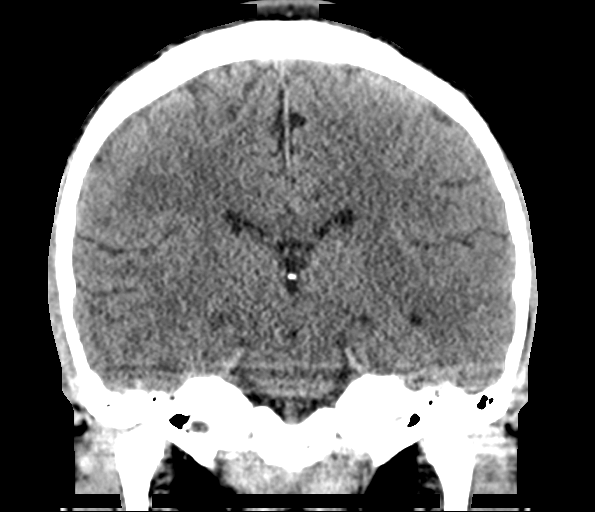

[Series 5: sagittal soft tissue · sagittal · 0.33mm/px · 3 of 62 slices shown]
[im 21/62  brain]
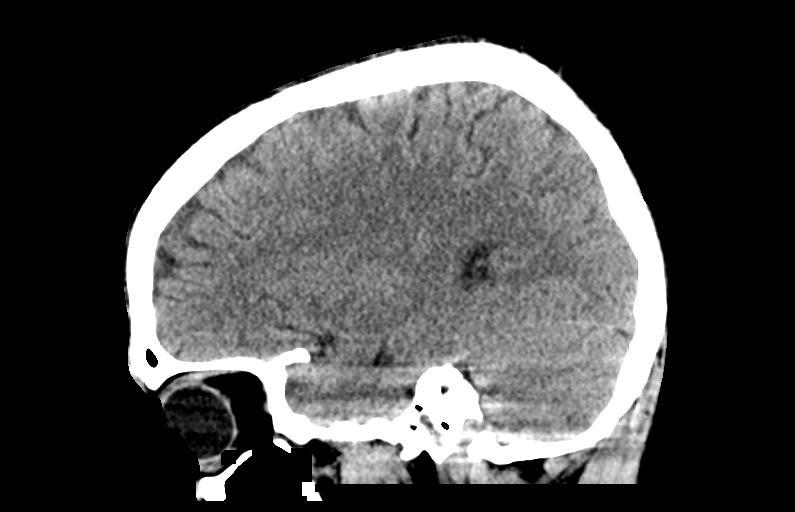
[im 31/62  brain]
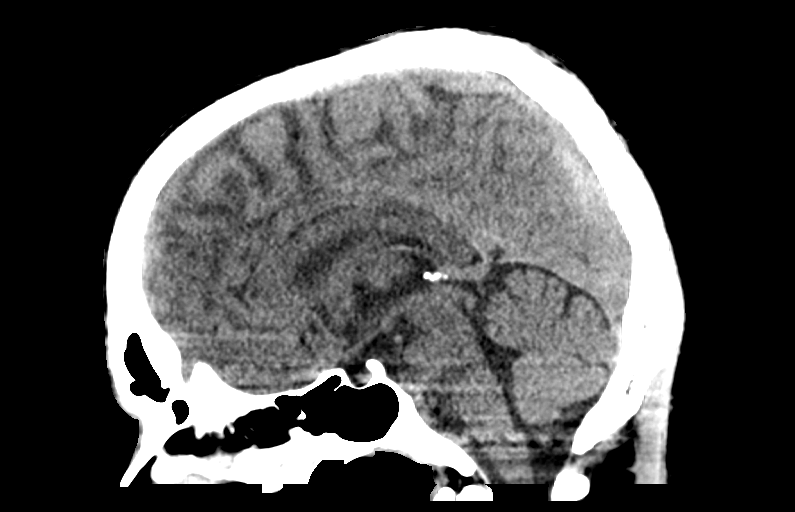
[im 41/62  brain]
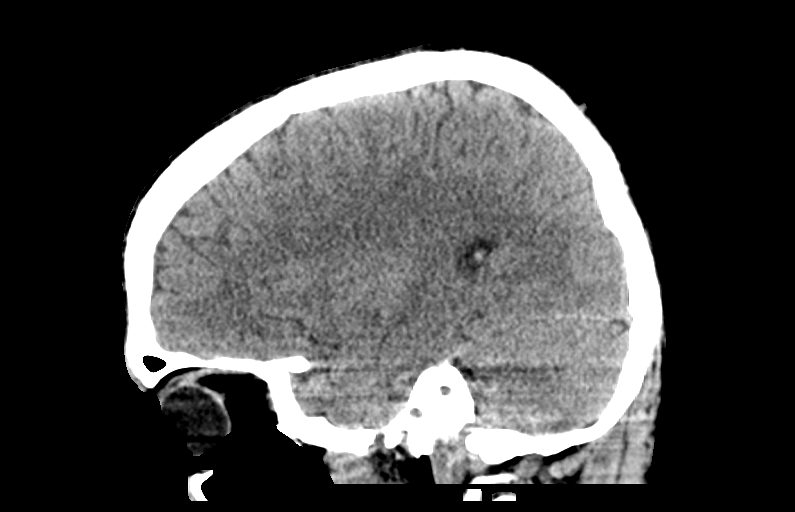

[15 of 47 positions shown; findings below may reference images not displayed]

FINDINGS: Brain: Normal anatomic configuration. No abnormal intra or
extra-axial mass lesion or fluid collection. No abnormal mass effect
or midline shift. No evidence of acute intracranial hemorrhage or
infarct. Ventricular size is normal. Cerebellum unremarkable.

Vascular: Unremarkable

Skull: Intact

Sinuses/Orbits: Mucous retention cyst within the right maxillary
sinus is partially visualized. Remaining paranasal sinuses are
clear. Orbits are unremarkable.

Other: Mastoid air cells and middle ear cavities are clear.
IMPRESSION: No acute intracranial abnormality.

Minimal right maxillary sinus disease

## 2023-02-03 LAB — HM HEPATITIS C SCREENING LAB: HM Hepatitis Screen: NEGATIVE

## 2023-05-12 ENCOUNTER — Encounter (HOSPITAL_COMMUNITY): Payer: Self-pay

## 2023-05-12 ENCOUNTER — Ambulatory Visit (HOSPITAL_COMMUNITY)
Admission: EM | Admit: 2023-05-12 | Discharge: 2023-05-12 | Disposition: A | Discharge status: Home / Self Care | Payer: BC Managed Care – PPO

## 2023-05-12 DIAGNOSIS — M436 Torticollis: Secondary | ICD-10-CM

## 2023-05-12 DIAGNOSIS — M62838 Other muscle spasm: Secondary | ICD-10-CM

## 2023-05-12 DIAGNOSIS — M7918 Myalgia, other site: Secondary | ICD-10-CM

## 2023-05-12 HISTORY — DX: Hidradenitis suppurativa: L73.2

## 2023-05-12 MED ORDER — CYCLOBENZAPRINE HCL 5 MG PO TABS: 5.0000 mg | ORAL_TABLET | Freq: Three times a day (TID) | ORAL | 0 refills | Status: AC | PRN

## 2023-05-12 NOTE — ED Triage Notes (Signed)
Pt states he has had neck pain since he was diagnosed with Influenza last Monday. At first he had neck tightness, but on 05/09/2202 he started having neck pain. The pain radiates up to his head causing a headache and at times will feel like a pulsing.

## 2023-05-12 NOTE — Discharge Instructions (Addendum)
 Please take flexeril  as prescribed(drowsiness precautions). Follow up with PCP if symptoms persist-call and make appt for evaluation in 2-3 days(sooner if worse), may need referral to ENT if symptoms do not resolve.  May use heat to area 20 min 3 x daily. May use over the counter lidocaine  gel or biofreeze as label directed. Massage therapy may help as well. May take tylenol /ibuprofen  as label directed for discomfort.  If you develop inability to move neck, nausea,vomiting, worsening headache or new symptoms go to ER immediately for further evaluation.

## 2023-05-12 NOTE — ED Provider Notes (Signed)
 [facility]    CSN: [id number] Arrival date & time: 05/12/23  0847      History   Chief Complaint Chief Complaint  Patient presents with   Neck Pain    HPI Kenneth Harrell is a 23 y.o. male.   23 year old male patient, Kenneth Harrell, presents to urgent care for evaluation of left-sided neck pain.  Patient states the pain started when he was diagnosed with the flu last week, patient reports left sided neck pain that radiates up back of his head and feels like it is pulsating with certain movements.  Patient states his mom has massaged his neck which, made it feel better, but she also noticed some lymph nodes in his neck at that time(none today).  Patient has not taken anything for pain. Pain is reproducible with left rotation of neck, patient has no meningeal signs at this time and is afebrile, with normal vital signs. Pt does not have a rash and denies fall, trauma,nausea,vomiting or fever at this time. Pt works at [employer].  The history is provided by the patient. No language interpreter was used.    Past Medical History:  Diagnosis Date   Abscess    Hydradenitis     Patient Active Problem List   Diagnosis Date Noted   Torticollis 05/12/2023   Musculoskeletal pain 05/12/2023   Muscle spasms of neck 05/12/2023   Salmonella enteritis 07/11/2020   Enterocolitis 07/10/2020   Hyponatremia 07/10/2020   Dehydration 07/10/2020   Thrombocytopenia (HCC) 07/10/2020    Past Surgical History:  Procedure Laterality Date   ACHILLES TENDON SURGERY         Home Medications    Prior to Admission medications   Medication Sig Start Date End Date Taking? Authorizing Provider  cyclobenzaprine  (FLEXERIL ) 5 MG tablet Take 1 tablet (5 mg total) by mouth 3 (three) times daily as needed for up to 5 days for muscle spasms. 05/12/23 05/17/23 Yes Spencer Peterkin, NP  doxycycline (VIBRA-TABS) 100 MG tablet Take 100 mg by mouth 2 (two) times daily.   Yes [provider]   EPINEPHrine  0.3 mg/0.3 mL IJ SOAJ injection see administration instructions. 06/13/20  Yes [provider]  Vitamin D, Ergocalciferol, (DRISDOL) 1.25 MG (50000 UNIT) CAPS capsule Take by mouth. 05/24/20  Yes [provider]  acetaminophen  (TYLENOL ) 325 MG tablet Take 650 mg by mouth every 6 (six) hours as needed for mild pain, fever or headache.    [provider]  finasteride (PROPECIA) 1 MG tablet Take 1 mg by mouth daily.    [provider]  potassium chloride  (KLOR-CON ) 10 MEQ tablet Take 2 tablets (20 mEq total) by mouth daily for 3 days. 07/12/20 07/15/20  Perri DELENA Meliton Mickey., MD    Family History Family History  Family history unknown: Yes    Social History Social History   Tobacco Use   Smoking status: Never    Passive exposure: Never   Smokeless tobacco: Never  Vaping Use   Vaping status: Never Used  Substance Use Topics   Alcohol use: Not Currently   Drug use: No     Allergies   Soy allergy (do not select) and Tamiflu  [oseltamivir ]   Review of Systems Review of Systems  Constitutional:  Negative for fever.  Musculoskeletal:  Positive for myalgias, neck pain and neck stiffness.  Skin: Negative.   All other systems reviewed and are negative.    Physical Exam Triage Vital Signs ED Triage Vitals  Encounter Vitals Group  BP 05/12/23 1049 122/73     Systolic BP Percentile --      Diastolic BP Percentile --      Pulse Rate 05/12/23 1049 89     Resp 05/12/23 1049 18     Temp 05/12/23 1049 98.2 F (36.8 C)     Temp Source 05/12/23 1049 Oral     SpO2 05/12/23 1049 97 %     Weight --      Height --      Head Circumference --      Peak Flow --      Pain Score 05/12/23 1045 8     Pain Loc --      Pain Education --      Exclude from Growth Chart --    No data found.  Updated Vital Signs BP 122/73 (BP Location: Left Arm)   Pulse 89   Temp 98.2 F (36.8 C) (Oral)   Resp 18   SpO2 97%   Visual Acuity Right Eye  Distance:   Left Eye Distance:   Bilateral Distance:    Right Eye Near:   Left Eye Near:    Bilateral Near:     Physical Exam Vitals and nursing note reviewed.  Constitutional:      General: He is not in acute distress.    Appearance: He is well-developed and well-groomed.  HENT:     Head: Normocephalic and atraumatic.  Eyes:     Conjunctiva/sclera: Conjunctivae normal.  Neck:     Trachea: Trachea normal.     Meningeal: Brudzinski's sign and Kernig's sign absent.      Comments: With radiation to occipital region Cardiovascular:     Rate and Rhythm: Normal rate.     Pulses:          Radial pulses are 2+ on the right side and 2+ on the left side.     Heart sounds: No murmur heard. Pulmonary:     Effort: Pulmonary effort is normal. No respiratory distress.  Abdominal:     Palpations: Abdomen is soft.     Tenderness: There is no abdominal tenderness.  Musculoskeletal:        General: No swelling.     Cervical back: Neck supple. Torticollis present. Muscular tenderness present. No spinous process tenderness.  Lymphadenopathy:     Cervical: No cervical adenopathy.  Skin:    General: Skin is warm and dry.     Capillary Refill: Capillary refill takes less than 2 seconds.     Findings: No rash.  Neurological:     General: No focal deficit present.     Mental Status: He is alert and oriented to person, place, and time.     GCS: GCS eye subscore is 4. GCS verbal subscore is 5. GCS motor subscore is 6.     Cranial Nerves: No cranial nerve deficit.     Sensory: No sensory deficit.     Comments: Strength 5/5  Psychiatric:        Attention and Perception: Attention normal.        Mood and Affect: Mood normal.        Speech: Speech normal.        Behavior: Behavior normal.      UC Treatments / Results  Labs (all labs ordered are listed, but only abnormal results are displayed) Labs Reviewed - No data to display  EKG   Radiology No results  found.  Procedures Procedures (including critical care time)  Medications  Ordered in UC Medications - No data to display  Initial Impression / Assessment and Plan / UC Course  I have reviewed the triage vital signs and the nursing notes.  Pertinent labs & imaging results that were available during my care of the patient were reviewed by me and considered in my medical decision making (see chart for details).    Discussed exam findings and plan of care with patient, strict go to ER precautions given.   Patient verbalized understanding to this provider.  Ddx: Torticollis, muscle spasm of neck, musculoskeletal pain/neck strain Final Clinical Impressions(s) / UC Diagnoses   Final diagnoses:  Musculoskeletal pain  Torticollis  Muscle spasms of neck     Discharge Instructions      Please take flexeril  as prescribed(drowsiness precautions). Follow up with PCP if symptoms persist-call and make appt for evaluation in 2-3 days(sooner if worse), may need referral to ENT if symptoms do not resolve.  May use heat to area 20 min 3 x daily. May use over the counter lidocaine  gel or biofreeze as label directed. Massage therapy may help as well. May take tylenol /ibuprofen  as label directed for discomfort.  If you develop inability to move neck, nausea,vomiting, worsening headache or new symptoms go to ER immediately for further evaluation.      ED Prescriptions     Medication Sig Dispense Auth. Provider   cyclobenzaprine  (FLEXERIL ) 5 MG tablet Take 1 tablet (5 mg total) by mouth 3 (three) times daily as needed for up to 5 days for muscle spasms. 15 tablet Riordan Walle, Rilla, NP      PDMP not reviewed this encounter.   Aminta Rilla, NP 05/12/23 1228

## 2023-07-15 NOTE — Progress Notes (Incomplete)
 HPI: Kenneth Harrell is a 23 y.o. male with a PMHx significant for thrombocytopenia, musculoskeletal pain, enterocollitis, and salmonella enteritis, who is here today to establish care.  Former PCP: not on file Last preventive routine visit: not on file  Exercise: Diet:  Sleep:  Alcohol Use:  Smoking: Vision:  Dental:  Chronic medical problems: ***  Concerns today: ***  Review of Systems See other pertinent positives and negatives in HPI.  Current Outpatient Medications on File Prior to Visit  Medication Sig Dispense Refill   acetaminophen (TYLENOL) 325 MG tablet Take 650 mg by mouth every 6 (six) hours as needed for mild pain, fever or headache.     doxycycline (VIBRA-TABS) 100 MG tablet Take 100 mg by mouth 2 (two) times daily.     EPINEPHrine 0.3 mg/0.3 mL IJ SOAJ injection see administration instructions.     finasteride (PROPECIA) 1 MG tablet Take 1 mg by mouth daily.     potassium chloride (KLOR-CON) 10 MEQ tablet Take 2 tablets (20 mEq total) by mouth daily for 3 days. 6 tablet 0   Vitamin D, Ergocalciferol, (DRISDOL) 1.25 MG (50000 UNIT) CAPS capsule Take by mouth.     No current facility-administered medications on file prior to visit.    Past Medical History:  Diagnosis Date   Abscess    Hydradenitis    Allergies  Allergen Reactions   Soy Allergy (Obsolete) Anaphylaxis   Tamiflu [Oseltamivir] Other (See Comments)    Did not do well with side effects     Family History  Family history unknown: Yes    Social History   Socioeconomic History   Marital status: Single    Spouse name: Not on file   Number of children: Not on file   Years of education: Not on file   Highest education level: Not on file  Occupational History   Occupation: student  Tobacco Use   Smoking status: Never    Passive exposure: Never   Smokeless tobacco: Never  Vaping Use   Vaping status: Never Used  Substance and Sexual Activity   Alcohol use: Not Currently   Drug  use: No   Sexual activity: Never  Other Topics Concern   Not on file  Social History Narrative   Not on file   Social Drivers of Health   Financial Resource Strain: Not on file  Food Insecurity: Low Risk  (01/30/2023)   Received from [facility]   Hunger Vital Sign    Worried About Running Out of Food in the Last Year: Never true    Ran Out of Food in the Last Year: Never true  Transportation Needs: No Transportation Needs (01/30/2023)   Received from [facility]    In the past 12 months, has lack of reliable transportation kept you from medical appointments, meetings, work or from getting things needed for daily living? : No  Physical Activity: Not on file  Stress: Not on file  Social Connections: Unknown (08/19/2021)   Received from [facility]   Social Network    Social Network: Not on file    There were no vitals filed for this visit.  There is no height or weight on file to calculate BMI.  Physical Exam  ASSESSMENT AND PLAN:  Mr. Chavira was seen today to establish care.   There are no diagnoses linked to this encounter.  No follow-ups on file.  Trula Ore, acting as a scribe for Betty Swaz[place], MD., have documented  all relevant documentation on the behalf of Betty [place], MD, as directed by  Betty Swaz[place], MD while in the presence of Betty [place], MD.   I, Betty Sw[place], MD, have reviewed all documentation for this visit. The documentation on 07/15/23 for the exam, diagnosis, procedures, and orders are all accurate and complete.  Betty G. Swaz[place]and, MD  [facility]. [place] office.

## 2023-07-16 ENCOUNTER — Ambulatory Visit: Payer: BC Managed Care – PPO | Admitting: Family Medicine

## 2023-09-24 ENCOUNTER — Ambulatory Visit: Admitting: Family Medicine

## 2023-10-28 ENCOUNTER — Other Ambulatory Visit: Payer: Self-pay | Admitting: Medical Genetics

## 2023-11-06 ENCOUNTER — Ambulatory Visit: Admitting: Family Medicine

## 2024-04-01 ENCOUNTER — Other Ambulatory Visit (HOSPITAL_COMMUNITY): Payer: Self-pay

## 2024-05-05 ENCOUNTER — Other Ambulatory Visit: Payer: Self-pay
